# Patient Record
Sex: Female | Born: 1960 | Race: White | Hispanic: No | Marital: Married | Smoking: Never smoker
Health system: Southern US, Community
[De-identification: ages and names within clinical notes are randomized; demographics above are authoritative.]

## PROBLEM LIST (undated history)

## (undated) DIAGNOSIS — R3989 Other symptoms and signs involving the genitourinary system: Secondary | ICD-10-CM

## (undated) DIAGNOSIS — M26609 Unspecified temporomandibular joint disorder, unspecified side: Secondary | ICD-10-CM

## (undated) DIAGNOSIS — M6740 Ganglion, unspecified site: Secondary | ICD-10-CM

## (undated) DIAGNOSIS — G43909 Migraine, unspecified, not intractable, without status migrainosus: Secondary | ICD-10-CM

## (undated) DIAGNOSIS — I10 Essential (primary) hypertension: Secondary | ICD-10-CM

## (undated) DIAGNOSIS — L509 Urticaria, unspecified: Secondary | ICD-10-CM

## (undated) HISTORY — PX: ABDOMINAL HYSTERECTOMY: SHX81

## (undated) HISTORY — PX: ABLATION: SHX5711

## (undated) HISTORY — DX: Essential (primary) hypertension: I10

## (undated) HISTORY — DX: Ganglion, unspecified site: M67.40

---

## 1998-10-27 ENCOUNTER — Other Ambulatory Visit: Admission: RE | Admit: 1998-10-27 | Discharge: 1998-10-27 | Payer: Self-pay | Admitting: Obstetrics and Gynecology

## 1999-10-31 ENCOUNTER — Other Ambulatory Visit: Admission: RE | Admit: 1999-10-31 | Discharge: 1999-10-31 | Payer: Self-pay | Admitting: Obstetrics and Gynecology

## 2000-11-09 ENCOUNTER — Other Ambulatory Visit: Admission: RE | Admit: 2000-11-09 | Discharge: 2000-11-09 | Payer: Self-pay | Admitting: Obstetrics and Gynecology

## 2001-12-09 ENCOUNTER — Other Ambulatory Visit: Admission: RE | Admit: 2001-12-09 | Discharge: 2001-12-09 | Payer: Self-pay | Admitting: Gynecology

## 2002-03-19 ENCOUNTER — Other Ambulatory Visit: Admission: RE | Admit: 2002-03-19 | Discharge: 2002-03-19 | Payer: Self-pay | Admitting: Obstetrics and Gynecology

## 2003-01-07 ENCOUNTER — Other Ambulatory Visit: Admission: RE | Admit: 2003-01-07 | Discharge: 2003-01-07 | Payer: Self-pay | Admitting: Obstetrics & Gynecology

## 2004-03-14 ENCOUNTER — Other Ambulatory Visit: Admission: RE | Admit: 2004-03-14 | Discharge: 2004-03-14 | Payer: Self-pay | Admitting: Obstetrics & Gynecology

## 2005-05-10 ENCOUNTER — Other Ambulatory Visit: Admission: RE | Admit: 2005-05-10 | Discharge: 2005-05-10 | Payer: Self-pay | Admitting: Obstetrics & Gynecology

## 2010-11-20 LAB — HM COLONOSCOPY

## 2011-03-07 ENCOUNTER — Encounter (HOSPITAL_COMMUNITY)
Admission: RE | Admit: 2011-03-07 | Discharge: 2011-03-07 | Disposition: A | Discharge status: Home / Self Care | Payer: BC Managed Care – PPO | Source: Ambulatory Visit | Attending: Obstetrics and Gynecology | Admitting: Obstetrics and Gynecology

## 2011-03-07 DIAGNOSIS — Z01818 Encounter for other preprocedural examination: Secondary | ICD-10-CM | POA: Insufficient documentation

## 2011-03-07 DIAGNOSIS — Z01810 Encounter for preprocedural cardiovascular examination: Secondary | ICD-10-CM | POA: Insufficient documentation

## 2011-03-07 DIAGNOSIS — Z01812 Encounter for preprocedural laboratory examination: Secondary | ICD-10-CM | POA: Insufficient documentation

## 2011-03-07 LAB — BASIC METABOLIC PANEL
Chloride: 103 mEq/L (ref 96–112)
GFR calc Af Amer: >60 mL/min (ref >60)
GFR calc non Af Amer: >60 mL/min (ref >60)
Potassium: 3.1 mEq/L — ABNORMAL LOW (ref 3.5–5.1)
Sodium: 140 mEq/L (ref 135–145)

## 2011-03-07 LAB — CBC
Platelets: 291 10*3/uL (ref 150–400)
RBC: 4.93 MIL/uL (ref 3.87–5.11)
RDW: 12.7 % (ref 11.5–15.5)
WBC: 8.3 10*3/uL (ref 4.0–10.5)

## 2011-03-14 ENCOUNTER — Ambulatory Visit (HOSPITAL_COMMUNITY)
Admission: RE | Admit: 2011-03-14 | Discharge: 2011-03-14 | Disposition: A | Discharge status: Home / Self Care | Payer: BC Managed Care – PPO | Source: Ambulatory Visit | Attending: Obstetrics and Gynecology | Admitting: Obstetrics and Gynecology

## 2011-03-14 ENCOUNTER — Other Ambulatory Visit: Payer: Self-pay | Admitting: Obstetrics and Gynecology

## 2011-03-14 DIAGNOSIS — Z01812 Encounter for preprocedural laboratory examination: Secondary | ICD-10-CM | POA: Insufficient documentation

## 2011-03-14 DIAGNOSIS — N920 Excessive and frequent menstruation with regular cycle: Secondary | ICD-10-CM | POA: Insufficient documentation

## 2011-03-14 DIAGNOSIS — Z01818 Encounter for other preprocedural examination: Secondary | ICD-10-CM | POA: Insufficient documentation

## 2011-03-14 LAB — PREGNANCY, URINE: Preg Test, Ur: NEGATIVE

## 2011-03-15 NOTE — Op Note (Signed)
  NAME:  Taylor Mullins, Taylor Mullins              ACCOUNT NO.:  [id number]  MEDICAL RECORD NO.:  [id number]           PATIENT TYPE:  O  LOCATION:  [facility]                          FACILITY:  [facility]  PHYSICIAN:  Malva Limes, M.D.    DATE OF BIRTH:  1961/03/30  DATE OF PROCEDURE:  03/14/2011 DATE OF DISCHARGE:                              OPERATIVE REPORT   PREOPERATIVE DIAGNOSIS:  Menorrhagia.  POSTOPERATIVE DIAGNOSIS:  Menorrhagia.  PROCEDURES: 1. Dilation and curettage. 2. NovaSure endometrial ablation.  SURGEON:  Malva Limes, MD  ANESTHESIA:  General with paracervical block.  DRAINS:  Foley bedside drainage.  ANTIBIOTIC:  Ancef 1 g.  SPECIMENS:  Endometrial curettings sent to Pathology.  COMPLICATIONS:  None.  PROCEDURE:  The patient was taken to the operating room where she was placed in dorsal supine position and general anesthetic was administered without difficulty.  She was placed in dorsal lithotomy position.  She was prepped and draped in the usual fashion for this procedure.  A sterile speculum was placed in the vagina.  20 mL of 1% lidocaine was used for paracervical block.  The cervix was then serially dilated to a 27-French.  The uterus was sounded to 9 cm.  The cervical length was 3 cm given a cavitary length of 6 cm.  Sharp curettage was then performed, tissue sent to pathology.  The NovaSure device was placed into the uterine cavity, opened, the width was 4.5 cm.  The device was then checked for placement.  Once that was accomplished, the device was turned on.  The NovaSure ablation was performed without difficulty.  The patient tolerated the procedure well.  The device was removed.  The patient was awoken and taken into the recovery room in stable condition.  She will be discharged home.  She will be sent home with Percocet to take p.r.n. She will follow up in the office in 4 weeks.          ______________________________ Malva Limes, M.D.     MA/MEDQ  D:   03/14/2011  T:  03/15/2011  Job:  [id number]  Electronically Signed by Malva Limes M.D. on 03/15/2011 09:00:23 AM

## 2011-11-09 ENCOUNTER — Other Ambulatory Visit: Payer: Self-pay | Admitting: Obstetrics and Gynecology

## 2013-01-20 ENCOUNTER — Other Ambulatory Visit: Payer: Self-pay | Admitting: Gastroenterology

## 2013-11-20 LAB — HM PAP SMEAR: HM Pap smear: NORMAL

## 2014-01-19 ENCOUNTER — Other Ambulatory Visit: Payer: Self-pay | Admitting: Obstetrics and Gynecology

## 2015-01-25 ENCOUNTER — Other Ambulatory Visit: Payer: Self-pay | Admitting: Obstetrics and Gynecology

## 2015-01-26 LAB — CYTOLOGY - PAP

## 2016-02-10 ENCOUNTER — Other Ambulatory Visit: Payer: Self-pay | Admitting: Obstetrics and Gynecology

## 2016-02-11 LAB — CYTOLOGY - PAP

## 2016-09-18 ENCOUNTER — Ambulatory Visit (INDEPENDENT_AMBULATORY_CARE_PROVIDER_SITE_OTHER): Payer: BC Managed Care – PPO | Admitting: Primary Care

## 2016-09-18 ENCOUNTER — Encounter: Payer: Self-pay | Admitting: Primary Care

## 2016-09-18 VITALS — BP 124/80 | HR 72 | Temp 98.4°F | Ht 68.0 in | Wt 206.2 lb

## 2016-09-18 DIAGNOSIS — Z23 Encounter for immunization: Secondary | ICD-10-CM | POA: Diagnosis not present

## 2016-09-18 DIAGNOSIS — I10 Essential (primary) hypertension: Secondary | ICD-10-CM | POA: Diagnosis not present

## 2016-09-18 DIAGNOSIS — N390 Urinary tract infection, site not specified: Secondary | ICD-10-CM | POA: Diagnosis not present

## 2016-09-18 NOTE — Assessment & Plan Note (Signed)
Diagnosed years ago, managed on HCTZ 25 mg and metoprolol succinate. BP stable in clinic today. Continue same.

## 2016-09-18 NOTE — Assessment & Plan Note (Signed)
Frequent UTI's from January through September 2017. Currently managed on macrodantin for which she takes after intercourse. No recent UTI. Continue same.

## 2016-09-18 NOTE — Progress Notes (Signed)
   Subjective:    Patient ID: Taylor Mullins, female    DOB: 03/11/1961, 55 y.o.   MRN: [id number]  HPI  Taylor Mullins is a 55 year old female who presents today to establish care and discuss the problems mentioned below. Will obtain old records. She follows with GYN annually. She will be undergoing a hysterectomy in June 2018. Her last physical was in January 2017.   1) Essential Hypertension: Diagnosed years ago. Currently managed on HCTZ 25 mg and Metoprolol Succinate 50 mg. She denies chest pain, dizziness, visual changes.   BP Readings from Last 3 Encounters:  09/18/16 124/80     2) Recurrent UTI: Currently managed on nitrofurantoin 100 mg for which she takes each time after intercourse. History of recurrent UTI's from January through September 2017. No prior history. No UTI since September 2017.  Review of Systems  Respiratory: Negative for shortness of breath.   Cardiovascular: Negative for chest pain.  Genitourinary: Negative for dysuria and frequency.       Scheduled to have hysterectomy in June 2018  Neurological: Negative for dizziness and headaches.       Past Medical History:  Diagnosis Date  . Essential hypertension   . Ganglion cyst    right ankle     Social History   Social History  . Marital status: Married    Spouse name: N/A  . Number of children: N/A  . Years of education: N/A   Occupational History  . Not on file.   Social History Main Topics  . Smoking status: Never Smoker  . Smokeless tobacco: Never Used  . Alcohol use No  . Drug use: Unknown  . Sexual activity: Not on file   Other Topics Concern  . Not on file   Social History Narrative   Married.   3 children.    Works as a Runner, broadcasting/film/videoTeacher at [employer].   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     No past surgical history on file.  Family History  Problem Relation Age of Onset  . Colon cancer Mother   . Hypertension Mother   . Hyperlipidemia Mother   . Crohn's disease  Father   . Heart disease Father     Allergies  Allergen Reactions  . 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    Lips swell and mouth    No current outpatient prescriptions on file prior to visit.   No current facility-administered medications on file prior to visit.     BP 124/80   Pulse 72   Temp 98.4 F (36.9 C) (Oral)   Ht 5\' 8"  (1.727 m)   Wt 206 lb 4 oz (93.6 kg)   BMI 31.36 kg/m    Objective:   Physical Exam  Constitutional: She appears well-nourished.  Neck: Neck supple.  Cardiovascular: Normal rate and regular rhythm.   Pulmonary/Chest: Effort normal and breath sounds normal.  Skin: Skin is warm and dry.  Psychiatric: She has a normal mood and affect.          Assessment & Plan:

## 2016-09-18 NOTE — Patient Instructions (Addendum)
You were provided with an influenza vaccination today.   Please schedule a physical with me in January 2018. You may also schedule a lab only appointment 3-4 days prior. We will discuss your lab results in detail during your physical.  It was a pleasure to meet you today! Please don't hesitate to call me with any questions. Welcome to [facility]!

## 2016-09-18 NOTE — Progress Notes (Signed)
Pre visit review using our clinic review tool, if applicable. No additional management support is needed unless otherwise documented below in the visit note. 

## 2016-09-21 ENCOUNTER — Encounter: Payer: Self-pay | Admitting: Primary Care

## 2016-12-19 ENCOUNTER — Encounter: Payer: Self-pay | Admitting: Primary Care

## 2016-12-19 ENCOUNTER — Ambulatory Visit (INDEPENDENT_AMBULATORY_CARE_PROVIDER_SITE_OTHER): Payer: BC Managed Care – PPO | Admitting: Primary Care

## 2016-12-19 VITALS — BP 124/82 | HR 84 | Temp 97.9°F | Ht 68.0 in | Wt 212.8 lb

## 2016-12-19 DIAGNOSIS — Z0000 Encounter for general adult medical examination without abnormal findings: Secondary | ICD-10-CM | POA: Insufficient documentation

## 2016-12-19 DIAGNOSIS — Z1159 Encounter for screening for other viral diseases: Secondary | ICD-10-CM

## 2016-12-19 DIAGNOSIS — I10 Essential (primary) hypertension: Secondary | ICD-10-CM

## 2016-12-19 NOTE — Patient Instructions (Addendum)
Complete lab work prior to leaving today. I will notify you of your results once received.   It's important to improve your diet by reducing consumption of fast food, fried food, processed snack foods, sugary drinks. Increase consumption of fresh vegetables and fruits, whole grains, water.  Ensure you are drinking 64 ounces of water daily.  Start exercising. You should be getting 150 minutes of moderate intensity exercise weekly.  Follow up in 1 year for your annual physical or sooner if needed.  It was a pleasure to see you today!

## 2016-12-19 NOTE — Progress Notes (Signed)
Pre visit review using our clinic review tool, if applicable. No additional management support is needed unless otherwise documented below in the visit note. 

## 2016-12-19 NOTE — Assessment & Plan Note (Signed)
Immunizations UTD. Pap UTD. Mammogram due in March 2018. Discussed the importance of a healthy diet and regular exercise in order for weight loss, and to reduce the risk of other medical diseases. Exam unremarkable. Labs pending. Follow up in 1 year for annual physical.

## 2016-12-19 NOTE — Progress Notes (Signed)
Subjective:    Patient ID: Taylor Mullins, female    DOB: March 12, 1961, 56 y.o.   MRN: [id number]  HPI  Taylor Mullins is a 56 year old female who presents today for complete physical.  Immunizations: -Tetanus: Completed in 2015 -Influenza: Completed in October 2017   Diet: She endorses a fair diet Breakfast: Skips, fast food  Lunch: Sandwich, soup, applesauce  Dinner: Meat, vegetable, starch, sandwich Snacks: Graham crackers, chocolate  Desserts: Daily Beverages: Sweet tea, water, juice  Exercise: She does not currently exercise Eye exam: Completed 2 years ago, no acute changes in vision Dental exam: Completes every 6 months Colonoscopy: Completed in 2014, due in 2019 Pap Smear: Completed in 2017, follows with GYN. Mammogram: Completed in 2017, due in March 2018 Hep C Screen: Never being screened.    Review of Systems  Constitutional: Negative for unexpected weight change.  HENT: Negative for rhinorrhea.   Respiratory: Negative for cough and shortness of breath.   Cardiovascular: Negative for chest pain.  Gastrointestinal: Negative for constipation and diarrhea.  Genitourinary: Negative for difficulty urinating and menstrual problem.  Musculoskeletal: Negative for arthralgias and myalgias.       Right knee pain when kneeling, right shoulder pain with certain movements, occurs once weekly/very sporatic. Experiences sharp pain to right shoulder.  Skin: Negative for rash.       Ganglion cyst to right lateral surgeon.   Allergic/Immunologic: Negative for environmental allergies.  Neurological: Negative for dizziness, numbness and headaches.  Psychiatric/Behavioral:       Denies concerns for anxiety or depression       Past Medical History:  Diagnosis Date  . Essential hypertension   . Ganglion cyst    right ankle     Social History   Social History  . Marital status: Married    Spouse name: N/A  . Number of children: N/A  . Years of education: N/A    Occupational History  . Not on file.   Social History Main Topics  . Smoking status: Never Smoker  . Smokeless tobacco: Never Used  . Alcohol use No  . Drug use: Unknown  . Sexual activity: Not on file   Other Topics Concern  . Not on file   Social History Narrative   Married.   3 children.    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     No past surgical history on file.  Family History  Problem Relation Age of Onset  . Colon cancer Mother   . Hypertension Mother   . Hyperlipidemia Mother   . Crohn's disease Father   . Heart disease Father     Allergies  Allergen Reactions  . 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    Lips swell and mouth    Current Outpatient Prescriptions on File Prior to Visit  Medication Sig Dispense Refill  . aspirin EC 81 MG tablet Take 81 mg by mouth daily.    . cetirizine (ZYRTEC) 10 MG chewable tablet Chew 10 mg by mouth daily.    . hydrochlorothiazide (HYDRODIURIL) 25 MG tablet Take 25 mg by mouth daily.    . metoprolol succinate (TOPROL-XL) 50 MG 24 hr tablet Take 50 mg by mouth daily. Take with or immediately following a meal.    . nitrofurantoin (MACRODANTIN) 100 MG capsule Take 100 mg by mouth. Take one capsule by mouth after sex     No current facility-administered medications on file prior to visit.  BP 124/82   Pulse 84   Temp 97.9 F (36.6 C) (Oral)   Ht 5\' 8"  (1.727 m)   Wt 212 lb 12.8 oz (96.5 kg)   SpO2 98%   BMI 32.36 kg/m    Objective:   Physical Exam  Constitutional: She is oriented to person, place, and time. She appears well-nourished.  HENT:  Right Ear: Tympanic membrane and ear canal normal.  Left Ear: Tympanic membrane and ear canal normal.  Nose: Nose normal.  Mouth/Throat: Oropharynx is clear and moist.  Eyes: Conjunctivae and EOM are normal. Pupils are equal, round, and reactive to light.  Neck: Neck supple. No thyromegaly present.  Cardiovascular:  Normal rate and regular rhythm.   No murmur heard. Pulmonary/Chest: Effort normal and breath sounds normal. She has no rales.  Abdominal: Soft. Bowel sounds are normal. There is no tenderness.  Musculoskeletal: Normal range of motion.  Lymphadenopathy:    She has no cervical adenopathy.  Neurological: She is alert and oriented to person, place, and time. She has normal reflexes. No cranial nerve deficit.  Skin: Skin is warm and dry. No rash noted.  1/2 cm raised ganglion cyst to right latera ankle. Non tender.  Psychiatric: She has a normal mood and affect.          Assessment & Plan:

## 2016-12-19 NOTE — Assessment & Plan Note (Signed)
Stable today, continue HCTZ and Toprol XL.

## 2016-12-20 ENCOUNTER — Other Ambulatory Visit: Payer: Self-pay | Admitting: Primary Care

## 2016-12-20 DIAGNOSIS — E785 Hyperlipidemia, unspecified: Secondary | ICD-10-CM

## 2016-12-20 LAB — COMPREHENSIVE METABOLIC PANEL
ALBUMIN: 4.9 g/dL (ref 3.5–5.2)
ALT: 19 U/L (ref 0–35)
AST: 22 U/L (ref 0–37)
Alkaline Phosphatase: 82 U/L (ref 39–117)
BUN: 17 mg/dL (ref 6–23)
CHLORIDE: 100 meq/L (ref 96–112)
CO2: 27 meq/L (ref 19–32)
CREATININE: 0.92 mg/dL (ref 0.40–1.20)
Calcium: 10.1 mg/dL (ref 8.4–10.5)
GFR: 67.43 mL/min (ref >60.00)
GLUCOSE: 84 mg/dL (ref 70–99)
Potassium: 3.6 mEq/L (ref 3.5–5.1)
SODIUM: 139 meq/L (ref 135–145)
Total Bilirubin: 0.7 mg/dL (ref 0.2–1.2)
Total Protein: 8.4 g/dL — ABNORMAL HIGH (ref 6.0–8.3)

## 2016-12-20 LAB — HEPATITIS C ANTIBODY: HCV AB: NEGATIVE

## 2016-12-20 LAB — LDL CHOLESTEROL, DIRECT: LDL DIRECT: 142 mg/dL

## 2016-12-20 LAB — LIPID PANEL
CHOL/HDL RATIO: 4
Cholesterol: 249 mg/dL — ABNORMAL HIGH (ref 0–200)
HDL: 63.7 mg/dL (ref >39.00)
NONHDL: 185.03
Triglycerides: 297 mg/dL — ABNORMAL HIGH (ref 0.0–149.0)
VLDL: 59.4 mg/dL — ABNORMAL HIGH (ref 0.0–40.0)

## 2017-01-07 ENCOUNTER — Other Ambulatory Visit: Payer: Self-pay | Admitting: Primary Care

## 2017-01-07 DIAGNOSIS — I10 Essential (primary) hypertension: Secondary | ICD-10-CM

## 2017-01-08 NOTE — Telephone Encounter (Signed)
Ok to refill? Electronically refill request for   metoprolol succinate (TOPROL-XL) 50 MG 24 hr tablet  hydrochlorothiazide (HYDRODIURIL) 25 MG tablet  Medications have not been prescribed by Jae DireKate. Last seen on CPE on 12/19/2016

## 2017-02-12 ENCOUNTER — Other Ambulatory Visit: Payer: Self-pay | Admitting: Obstetrics and Gynecology

## 2017-02-14 ENCOUNTER — Encounter: Payer: Self-pay | Admitting: Primary Care

## 2017-02-14 ENCOUNTER — Ambulatory Visit (INDEPENDENT_AMBULATORY_CARE_PROVIDER_SITE_OTHER): Payer: BC Managed Care – PPO | Admitting: Primary Care

## 2017-02-14 ENCOUNTER — Ambulatory Visit (INDEPENDENT_AMBULATORY_CARE_PROVIDER_SITE_OTHER)
Admission: RE | Admit: 2017-02-14 | Discharge: 2017-02-14 | Disposition: A | Discharge status: Home / Self Care | Payer: BC Managed Care – PPO | Source: Ambulatory Visit | Attending: Primary Care | Admitting: Primary Care

## 2017-02-14 VITALS — BP 124/80 | HR 80 | Temp 97.4°F | Ht 68.0 in | Wt 207.8 lb

## 2017-02-14 DIAGNOSIS — M79674 Pain in right toe(s): Secondary | ICD-10-CM

## 2017-02-14 NOTE — Patient Instructions (Signed)
Complete xray(s) prior to leaving today.   Continue Advil and rest.   I will be in touch with you soon! It was a pleasure to see you today!

## 2017-02-14 NOTE — Progress Notes (Signed)
Subjective:    Patient ID: Taylor Mullins, female    DOB: 1960-11-21, 56 y.o.   MRN: [id number]  HPI  Ms. Hair is a 56 year old female with a chief complaint of right second toe pain. Her pain has been present for the past two weeks. She was walking around the [place] area for three days with her class, this made her pain worse. She notices swelling in the morning and will experience increased pain throughout the day. Her pain is worse with weight bearing. She denies injury, erythema. She's taken Advil and rested with temporary improvement.   Review of Systems  Constitutional: Negative for fever.  Musculoskeletal: Positive for arthralgias.  Skin: Negative for color change.       Past Medical History:  Diagnosis Date  . Essential hypertension   . Ganglion cyst    right ankle     Social History   Social History  . Marital status: Married    Spouse name: N/A  . Number of children: N/A  . Years of education: N/A   Occupational History  . Not on file.   Social History Main Topics  . Smoking status: Never Smoker  . Smokeless tobacco: Never Used  . Alcohol use No  . Drug use: Unknown  . Sexual activity: Not on file   Other Topics Concern  . Not on file   Social History Narrative   Married.   3 children.    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     No past surgical history on file.  Family History  Problem Relation Age of Onset  . Colon cancer Mother   . Hypertension Mother   . Hyperlipidemia Mother   . Crohn's disease Father   . Heart disease Father     Allergies  Allergen Reactions  . 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    Lips swell and mouth    Current Outpatient Prescriptions on File Prior to Visit  Medication Sig Dispense Refill  . aspirin EC 81 MG tablet Take 81 mg by mouth daily.    . cetirizine (ZYRTEC) 10 MG chewable tablet Chew 10 mg by mouth daily.    . hydrochlorothiazide (HYDRODIURIL)  25 MG tablet TAKE 1 TABLET BY MOUTH EVERY MORNING TO CONTROL HIGH BLOOD PRESSURE 90 tablet 3  . metoprolol succinate (TOPROL-XL) 50 MG 24 hr tablet TAKE 1 TABLET BY MOUTH DAILY TO CONTROL BLOOD PRESSURE 90 tablet 3  . nitrofurantoin (MACRODANTIN) 100 MG capsule Take 100 mg by mouth. Take one capsule by mouth after sex     No current facility-administered medications on file prior to visit.     BP 124/80   Pulse 80   Temp 97.4 F (36.3 C) (Oral)   Ht 5\' 8"  (1.727 m)   Wt 207 lb 12.8 oz (94.3 kg)   SpO2 97%   BMI 31.60 kg/m    Objective:   Physical Exam  Constitutional: She appears well-nourished.  Neck: Neck supple.  Cardiovascular: Normal rate and regular rhythm.   Pulmonary/Chest: Effort normal and breath sounds normal.  Musculoskeletal:  Tenderness to PIP joint of second toe of right foot. Minor swelling. No erythema.  Skin: Skin is warm and dry. No erythema.          Assessment & Plan:  Toe Pain:  Located to second toe of right foot x 2 weeks. No injury, did walk around in [place] x 3 days the week after  pain stared/ Exam today without evidence of infection.  Suspect tendon strain/joint inflammation. Check xray today. Discussed use of Advil, rest. Buddy taped toes in clinic. Discussed proper shoes.  Will await results.  Morrie Sheldonlark,Clance Baquero Kendal, NP

## 2017-02-14 NOTE — Progress Notes (Signed)
Pre visit review using our clinic review tool, if applicable. No additional management support is needed unless otherwise documented below in the visit note. 

## 2017-02-15 LAB — CYTOLOGY - PAP

## 2017-03-06 ENCOUNTER — Encounter: Payer: Self-pay | Admitting: Primary Care

## 2017-03-06 ENCOUNTER — Ambulatory Visit (INDEPENDENT_AMBULATORY_CARE_PROVIDER_SITE_OTHER): Payer: BC Managed Care – PPO | Admitting: Primary Care

## 2017-03-06 VITALS — BP 122/82 | HR 71 | Temp 97.8°F | Ht 68.0 in | Wt 207.1 lb

## 2017-03-06 DIAGNOSIS — H9202 Otalgia, left ear: Secondary | ICD-10-CM

## 2017-03-06 NOTE — Patient Instructions (Signed)
Ear Fullness/Ear Pressure: Try using Flonase (fluticasone) nasal spray. Instill 1 spray in each nostril twice daily.   Continue Zyrtec tablets for allergies.  Please notify me if no improvement in 1 week.  It was a pleasure to see you today!   Eustachian Tube Dysfunction The eustachian tube connects the middle ear to the back of the nose. It regulates air pressure in the middle ear by allowing air to move between the ear and nose. It also helps to drain fluid from the middle ear space. When the eustachian tube does not function properly, air pressure, fluid, or both can build up in the middle ear. Eustachian tube dysfunction can affect one or both ears. What are the causes? This condition happens when the eustachian tube becomes blocked or cannot open normally. This may result from:  Ear infections.  Colds and other upper respiratory infections.  Allergies.  Irritation, such as from cigarette smoke or acid from the stomach coming up into the esophagus (gastroesophageal reflux).  Sudden changes in air pressure, such as from descending in an airplane.  Abnormal growths in the nose or throat, such as nasal polyps, tumors, or enlarged tissue at the back of the throat (adenoids). What increases the risk? This condition may be more likely to develop in people who smoke and people who are overweight. Eustachian tube dysfunction may also be more likely to develop in children, especially children who have:  Certain birth defects of the mouth, such as cleft palate.  Large tonsils and adenoids. What are the signs or symptoms? Symptoms of this condition may include:  A feeling of fullness in the ear.  Ear pain.  Clicking or popping noises in the ear.  Ringing in the ear.  Hearing loss.  Loss of balance. Symptoms may get worse when the air pressure around you changes, such as when you travel to an area of high elevation or fly on an airplane. How is this diagnosed? This condition  may be diagnosed based on:  Your symptoms.  A physical exam of your ear, nose, and throat.  Tests, such as those that measure:  The movement of your eardrum (tympanogram).  Your hearing (audiometry). How is this treated? Treatment depends on the cause and severity of your condition. If your symptoms are mild, you may be able to relieve your symptoms by moving air into ("popping") your ears. If you have symptoms of fluid in your ears, treatment may include:  Decongestants.  Antihistamines.  Nasal sprays or ear drops that contain medicines that reduce swelling (steroids). In some cases, you may need to have a procedure to drain the fluid in your eardrum (myringotomy). In this procedure, a small tube is placed in the eardrum to:  Drain the fluid.  Restore the air in the middle ear space. Follow these instructions at home:  Take over-the-counter and prescription medicines only as told by your health care provider.  Use techniques to help pop your ears as recommended by your health care provider. These may include:  Chewing gum.  Yawning.  Frequent, forceful swallowing.  Closing your mouth, holding your nose closed, and gently blowing as if you are trying to blow air out of your nose.  Do not do any of the following until your health care provider approves:  Travel to high altitudes.  Fly in airplanes.  Work in a Estate agent or room.  Scuba dive.  Keep your ears dry. Dry your ears completely after showering or bathing.  Do not smoke.  Keep all follow-up visits as told by your health care provider. This is important. Contact a health care provider if:  Your symptoms do not go away after treatment.  Your symptoms come back after treatment.  You are unable to pop your ears.  You have:  A fever.  Pain in your ear.  Pain in your head or neck.  Fluid draining from your ear.  Your hearing suddenly changes.  You become very dizzy.  You lose your  balance. This information is not intended to replace advice given to you by your health care provider. Make sure you discuss any questions you have with your health care provider. Document Released: 12/03/2015 Document Revised: 04/13/2016 Document Reviewed: 11/25/2014 Elsevier Interactive Patient Education  2017 [employer].

## 2017-03-06 NOTE — Progress Notes (Signed)
Pre visit review using our clinic review tool, if applicable. No additional management support is needed unless otherwise documented below in the visit note. 

## 2017-03-06 NOTE — Progress Notes (Signed)
Subjective:    Patient ID: Taylor Mullins, female    DOB: Aug 14, 1961, 57 y.o.   MRN: [id number]  HPI  Taylor Mullins is a 56 year old female who presents today with a chief complaint of ear pain. She also reports fullness. Her symptoms are present to the left ear. Her pain has been present for the past 2 weeks. She denies fevers, cough, nasal congestion, sore thraot. She's been taking Zyrtec daily.   Review of Systems  Constitutional: Negative for chills, fatigue and fever.  HENT: Positive for ear pain. Negative for congestion, sinus pressure and sore throat.        Ear fullness  Respiratory: Negative for cough.   Allergic/Immunologic: Positive for environmental allergies.       Past Medical History:  Diagnosis Date  . Essential hypertension   . Ganglion cyst    right ankle     Social History   Social History  . Marital status: Married    Spouse name: N/A  . Number of children: N/A  . Years of education: N/A   Occupational History  . Not on file.   Social History Main Topics  . Smoking status: Never Smoker  . Smokeless tobacco: Never Used  . Alcohol use No  . Drug use: Unknown  . Sexual activity: Not on file   Other Topics Concern  . Not on file   Social History Narrative   Married.   3 children.    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     No past surgical history on file.  Family History  Problem Relation Age of Onset  . Colon cancer Mother   . Hypertension Mother   . Hyperlipidemia Mother   . Crohn's disease Father   . Heart disease Father     Allergies  Allergen Reactions  . 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    Lips swell and mouth    Current Outpatient Prescriptions on File Prior to Visit  Medication Sig Dispense Refill  . aspirin EC 81 MG tablet Take 81 mg by mouth daily.    . cetirizine (ZYRTEC) 10 MG chewable tablet Chew 10 mg by mouth daily.    . hydrochlorothiazide (HYDRODIURIL)  25 MG tablet TAKE 1 TABLET BY MOUTH EVERY MORNING TO CONTROL HIGH BLOOD PRESSURE 90 tablet 3  . metoprolol succinate (TOPROL-XL) 50 MG 24 hr tablet TAKE 1 TABLET BY MOUTH DAILY TO CONTROL BLOOD PRESSURE 90 tablet 3   No current facility-administered medications on file prior to visit.     BP 122/82   Pulse 71   Temp 97.8 F (36.6 C) (Oral)   Ht  (1.727 m)   Wt 207 lb 1.9 oz (93.9 kg)   SpO2 96%   BMI 31.49 kg/m    Objective:   Physical Exam  Constitutional: She appears well-nourished.  HENT:  Right Ear: Ear canal normal. Tympanic membrane is retracted. Tympanic membrane is not erythematous.  Left Ear: Tympanic membrane and ear canal normal. Tympanic membrane is not erythematous.  Nose: Right sinus exhibits no maxillary sinus tenderness and no frontal sinus tenderness. Left sinus exhibits no maxillary sinus tenderness and no frontal sinus tenderness.  Mouth/Throat: Oropharynx is clear and moist.  Eyes: Conjunctivae are normal.  Neck: Neck supple.  Cardiovascular: Normal rate and regular rhythm.   Pulmonary/Chest: Effort normal and breath sounds normal. She has no wheezes. She has no rales.  Lymphadenopathy:    She has  no cervical adenopathy.  Skin: Skin is warm and dry.          Assessment & Plan:  Otalgia:  Present for 2 weeks to left ear. No other URI symptoms. Exam today without evidence of infection. Symptoms likely secondary to eustachian tube dysfunction and/or allergies. Will treat with Flonase BID, continue Zyrtec. She will follow up PRN.  Morrie Sheldon, NP

## 2017-03-15 ENCOUNTER — Other Ambulatory Visit: Payer: Self-pay | Admitting: Urology

## 2017-04-06 ENCOUNTER — Other Ambulatory Visit: Payer: Self-pay | Admitting: Urology

## 2017-04-17 ENCOUNTER — Ambulatory Visit: Admit: 2017-04-17 | Payer: BC Managed Care – PPO | Admitting: Urology

## 2017-04-17 SURGERY — ANTERIOR (CYSTOCELE) AND POSTERIOR REPAIR (RECTOCELE)
Anesthesia: General

## 2017-04-18 ENCOUNTER — Other Ambulatory Visit (INDEPENDENT_AMBULATORY_CARE_PROVIDER_SITE_OTHER): Payer: BC Managed Care – PPO

## 2017-04-18 DIAGNOSIS — E785 Hyperlipidemia, unspecified: Secondary | ICD-10-CM | POA: Diagnosis not present

## 2017-04-19 LAB — LIPID PANEL
CHOL/HDL RATIO: 4
CHOLESTEROL: 241 mg/dL — AB (ref 0–200)
HDL: 63.3 mg/dL (ref >39.00)
NONHDL: 177.3
Triglycerides: 272 mg/dL — ABNORMAL HIGH (ref 0.0–149.0)
VLDL: 54.4 mg/dL — ABNORMAL HIGH (ref 0.0–40.0)

## 2017-04-19 LAB — LDL CHOLESTEROL, DIRECT: Direct LDL: 143 mg/dL

## 2017-05-08 ENCOUNTER — Other Ambulatory Visit: Payer: Self-pay | Admitting: Obstetrics and Gynecology

## 2017-05-28 NOTE — Progress Notes (Signed)
Please place orders in EPIC as patient is being scheduled for a pre-op appointment! Thank you! 

## 2017-06-08 NOTE — Patient Instructions (Signed)
Taylor Mullins  06/08/2017   Your procedure is scheduled on: 06-19-17  Report to [facility] Main  Entrance [place]  elevators to 3rd floor to  Short Stay Center at 530AM.    Call this number if you have problems the morning of surgery [phone]    Remember: ONLY 1 PERSON MAY GO WITH YOU TO SHORT STAY TO GET  READY MORNING OF YOUR SURGERY.  Do not eat food or drink liquids :After Midnight.     Take these medicines the morning of surgery with A SIP OF WATER: metoprolol                                You may not have any metal on your body including hair pins and              piercings  Do not wear jewelry, make-up, lotions, powders or perfumes, deodorant             Do not wear nail polish.  Do not shave  48 hours prior to surgery.                Do not bring valuables to the hospital. [place] IS NOT             RESPONSIBLE   FOR VALUABLES.  Contacts, dentures or bridgework may not be worn into surgery.  Leave suitcase in the car. After surgery it may be brought to your room.               Please read over the following fact sheets you were given: _____________________________________________________________________             [facility] - Preparing for [facility] Before surgery, you can play an important role.  Because skin is not sterile, your skin needs to be as free of germs as possible.  You can reduce the number of germs on your skin by washing with CHG (chlorahexidine gluconate) soap before surgery.  CHG is an antiseptic cleaner which kills germs and bonds with the skin to continue killing germs even after washing. Please DO NOT use if you have an allergy to CHG or antibacterial soaps.  If your skin becomes reddened/irritated stop using the CHG and inform your nurse when you arrive at Short Stay. Do not shave (including legs and underarms) for at least 48 hours prior to the first CHG shower.  You may shave your face/neck. Please follow these  instructions carefully:  1.  Shower with CHG Soap the night before surgery and the  morning of Surgery.  2.  If you choose to wash your hair, wash your hair first as usual with your  normal  shampoo.  3.  After you shampoo, rinse your hair and body thoroughly to remove the  shampoo.                           4.  Use CHG as you would any other liquid soap.  You can apply chg directly  to the skin and wash                       Gently with a scrungie or clean washcloth.  5.  Apply the CHG Soap to your body ONLY FROM THE NECK  DOWN.   Do not use on face/ open                           Wound or open sores. Avoid contact with eyes, ears mouth and genitals (private parts).                       Wash face,  Genitals (private parts) with your normal soap.             6.  Wash thoroughly, paying special attention to the area where your surgery  will be performed.  7.  Thoroughly rinse your body with warm water from the neck down.  8.  DO NOT shower/wash with your normal soap after using and rinsing off  the CHG Soap.                9.  Pat yourself dry with a clean towel.            10.  Wear clean pajamas.            11.  Place clean sheets on your bed the night of your first shower and do not  sleep with pets. Day of Surgery : Do not apply any lotions/deodorants the morning of surgery.  Please wear clean clothes to the hospital/surgery center.  FAILURE TO FOLLOW THESE INSTRUCTIONS MAY RESULT IN THE CANCELLATION OF YOUR SURGERY PATIENT SIGNATURE_________________________________  NURSE SIGNATURE__________________________________  ________________________________________________________________________  WHAT IS A BLOOD TRANSFUSION? Blood Transfusion Information  A transfusion is the replacement of blood or some of its parts. Blood is made up of multiple cells which provide different functions.  Red blood cells carry oxygen and are used for blood loss replacement.  White blood cells fight against  infection.  Platelets control bleeding.  Plasma helps clot blood.  Other blood products are available for specialized needs, such as hemophilia or other clotting disorders. BEFORE THE TRANSFUSION  Who gives blood for transfusions?   Healthy volunteers who are fully evaluated to make sure their blood is safe. This is blood bank blood. Transfusion therapy is the safest it has ever been in the practice of medicine. Before blood is taken from a donor, a complete history is taken to make sure that person has no history of diseases nor engages in risky social behavior (examples are intravenous drug use or sexual activity with multiple partners). The donor's travel history is screened to minimize risk of transmitting infections, such as malaria. The donated blood is tested for signs of infectious diseases, such as HIV and hepatitis. The blood is then tested to be sure it is compatible with you in order to minimize the chance of a transfusion reaction. If you or a relative donates blood, this is often done in anticipation of surgery and is not appropriate for emergency situations. It takes many days to process the donated blood. RISKS AND COMPLICATIONS Although transfusion therapy is very safe and saves many lives, the main dangers of transfusion include:   Getting an infectious disease.  Developing a transfusion reaction. This is an allergic reaction to something in the blood you were given. Every precaution is taken to prevent this. The decision to have a blood transfusion has been considered carefully by your caregiver before blood is given. Blood is not given unless the benefits outweigh the risks. AFTER THE TRANSFUSION  Right after receiving a blood transfusion, you will usually feel much better and more energetic.  This is especially true if your red blood cells have gotten low (anemic). The transfusion raises the level of the red blood cells which carry oxygen, and this usually causes an energy  increase.  The nurse administering the transfusion will monitor you carefully for complications. HOME CARE INSTRUCTIONS  No special instructions are needed after a transfusion. You may find your energy is better. Speak with your caregiver about any limitations on activity for underlying diseases you may have. SEEK MEDICAL CARE IF:   Your condition is not improving after your transfusion.  You develop redness or irritation at the intravenous (IV) site. SEEK IMMEDIATE MEDICAL CARE IF:  Any of the following symptoms occur over the next 12 hours:  Shaking chills.  You have a temperature by mouth above 102 F (38.9 C), not controlled by medicine.  Chest, back, or muscle pain.  People around you feel you are not acting correctly or are confused.  Shortness of breath or difficulty breathing.  Dizziness and fainting.  You get a rash or develop hives.  You have a decrease in urine output.  Your urine turns a dark color or changes to pink, red, or brown. Any of the following symptoms occur over the next 10 days:  You have a temperature by mouth above 102 F (38.9 C), not controlled by medicine.  Shortness of breath.  Weakness after normal activity.  The white part of the eye turns yellow (jaundice).  You have a decrease in the amount of urine or are urinating less often.  Your urine turns a dark color or changes to pink, red, or brown. Document Released: 11/03/2000 Document Revised: 01/29/2012 Document Reviewed: 06/22/2008 [facility] Main Patient Information 2014 [place], [place].  _______________________________________________________________________

## 2017-06-11 ENCOUNTER — Encounter (HOSPITAL_COMMUNITY): Payer: Self-pay

## 2017-06-11 ENCOUNTER — Encounter (HOSPITAL_COMMUNITY)
Admission: RE | Admit: 2017-06-11 | Discharge: 2017-06-11 | Disposition: A | Discharge status: Home / Self Care | Payer: BC Managed Care – PPO | Source: Ambulatory Visit | Attending: Urology | Admitting: Urology

## 2017-06-11 DIAGNOSIS — I10 Essential (primary) hypertension: Secondary | ICD-10-CM | POA: Diagnosis not present

## 2017-06-11 DIAGNOSIS — Z01812 Encounter for preprocedural laboratory examination: Secondary | ICD-10-CM | POA: Diagnosis present

## 2017-06-11 DIAGNOSIS — Z01810 Encounter for preprocedural cardiovascular examination: Secondary | ICD-10-CM | POA: Diagnosis present

## 2017-06-11 DIAGNOSIS — N8110 Cystocele, unspecified: Secondary | ICD-10-CM | POA: Insufficient documentation

## 2017-06-11 DIAGNOSIS — N393 Stress incontinence (female) (male): Secondary | ICD-10-CM | POA: Insufficient documentation

## 2017-06-11 HISTORY — DX: Unspecified temporomandibular joint disorder, unspecified side: M26.609

## 2017-06-11 HISTORY — DX: Other symptoms and signs involving the genitourinary system: R39.89

## 2017-06-11 HISTORY — DX: Migraine, unspecified, not intractable, without status migrainosus: G43.909

## 2017-06-11 LAB — ABO/RH: ABO/RH(D): A POS

## 2017-06-11 LAB — CBC
HEMATOCRIT: 42.1 % (ref 36.0–46.0)
Hemoglobin: 14.2 g/dL (ref 12.0–15.0)
MCH: 30.7 pg (ref 26.0–34.0)
MCHC: 33.7 g/dL (ref 30.0–36.0)
MCV: 90.9 fL (ref 78.0–100.0)
PLATELETS: 237 10*3/uL (ref 150–400)
RBC: 4.63 MIL/uL (ref 3.87–5.11)
RDW: 13.5 % (ref 11.5–15.5)
WBC: 7.7 10*3/uL (ref 4.0–10.5)

## 2017-06-11 LAB — BASIC METABOLIC PANEL
Anion gap: 10 (ref 5–15)
BUN: 16 mg/dL (ref 6–20)
CALCIUM: 9.3 mg/dL (ref 8.9–10.3)
CHLORIDE: 102 mmol/L (ref 101–111)
CO2: 26 mmol/L (ref 22–32)
CREATININE: 0.82 mg/dL (ref 0.44–1.00)
GFR calc non Af Amer: >60 mL/min (ref >60)
Glucose, Bld: 106 mg/dL — ABNORMAL HIGH (ref 65–99)
Potassium: 3.3 mmol/L — ABNORMAL LOW (ref 3.5–5.1)
SODIUM: 138 mmol/L (ref 135–145)

## 2017-06-11 LAB — PROTIME-INR
INR: 0.94
PROTHROMBIN TIME: 12.5 s (ref 11.4–15.2)

## 2017-06-11 LAB — APTT: APTT: 26 s (ref 24–36)

## 2017-06-11 NOTE — Progress Notes (Signed)
Patient refused to sign informed surgical consent at pre-op appt. Patient states that at her last office visit with MacDiarmid last week, she made it evident that she did not want the sling procedure. At pre-op patient states she does not want the sling because she does not want to go home home with a foley catheter. Surgical consent still contains sling procedure. RN advised patient to contact MacDiarmid office to determine how she should proceed. RN will defer signing of consent for morning of surgery in case patient does not get in touch with surgeon before day of surgery .

## 2017-06-12 ENCOUNTER — Other Ambulatory Visit (HOSPITAL_COMMUNITY): Payer: BC Managed Care – PPO

## 2017-06-12 ENCOUNTER — Other Ambulatory Visit: Payer: Self-pay | Admitting: Urology

## 2017-06-18 ENCOUNTER — Other Ambulatory Visit: Payer: Self-pay | Admitting: Obstetrics and Gynecology

## 2017-06-18 NOTE — Progress Notes (Signed)
Left message with Dr Loraine LericheMark Anderson's office to place [facility] 06/19/17 surgery.

## 2017-06-18 NOTE — Anesthesia Preprocedure Evaluation (Addendum)
Anesthesia Evaluation  Patient identified by MRN, date of birth, ID band Patient awake    Reviewed: Allergy & Precautions, NPO status , Patient's Chart, lab work & pertinent test results, reviewed documented beta blocker date and time   Airway Mallampati: II  TM Distance: >3 FB Neck ROM: Full    Dental  (+) Dental Advisory Given   Pulmonary neg pulmonary ROS,    breath sounds clear to auscultation       Cardiovascular hypertension, Pt. on medications and Pt. on home beta blockers  Rhythm:Regular Rate:Normal     Neuro/Psych  Headaches,    GI/Hepatic negative GI ROS, Neg liver ROS,   Endo/Other  negative endocrine ROS  Renal/GU negative Renal ROS     Musculoskeletal negative musculoskeletal ROS (+)   Abdominal   Peds  Hematology negative hematology ROS (+)   Anesthesia Other Findings   Reproductive/Obstetrics                            Lab Results  Component Value Date   WBC 7.7 06/11/2017   HGB 14.2 06/11/2017   HCT 42.1 06/11/2017   MCV 90.9 06/11/2017   PLT 237 06/11/2017   Lab Results  Component Value Date   CREATININE 0.82 06/11/2017   BUN 16 06/11/2017   NA 138 06/11/2017   K 3.3 (L) 06/11/2017   CL 102 06/11/2017   CO2 26 06/11/2017    Anesthesia Physical Anesthesia Plan  ASA: II  Anesthesia Plan: General   Post-op Pain Management:    Induction: Intravenous  PONV Risk Score and Plan: 4 or greater and Ondansetron, Dexamethasone, Midazolam, Scopolamine patch - Pre-op and Treatment may vary due to age or medical condition  Airway Management Planned: Oral ETT  Additional Equipment:   Intra-op Plan:   Post-operative Plan: Extubation in OR  Informed Consent: I have reviewed the patients History and Physical, chart, labs and discussed the procedure including the risks, benefits and alternatives for the proposed anesthesia with the patient or authorized  representative who has indicated his/her understanding and acceptance.   Dental advisory given  Plan Discussed with: CRNA  Anesthesia Plan Comments:        Anesthesia Quick Evaluation

## 2017-06-19 ENCOUNTER — Encounter (HOSPITAL_COMMUNITY)
Admission: RE | Disposition: A | Discharge status: Home / Self Care | Payer: Self-pay | Source: Ambulatory Visit | Attending: Urology

## 2017-06-19 ENCOUNTER — Encounter (HOSPITAL_COMMUNITY): Payer: Self-pay | Admitting: *Deleted

## 2017-06-19 ENCOUNTER — Ambulatory Visit (HOSPITAL_COMMUNITY): Payer: BC Managed Care – PPO | Admitting: Anesthesiology

## 2017-06-19 ENCOUNTER — Observation Stay (HOSPITAL_COMMUNITY)
Admission: RE | Admit: 2017-06-19 | Discharge: 2017-06-20 | Disposition: A | Discharge status: Home / Self Care | Payer: BC Managed Care – PPO | Source: Ambulatory Visit | Attending: Urology | Admitting: Urology

## 2017-06-19 DIAGNOSIS — N993 Prolapse of vaginal vault after hysterectomy: Secondary | ICD-10-CM | POA: Diagnosis not present

## 2017-06-19 DIAGNOSIS — N814 Uterovaginal prolapse, unspecified: Secondary | ICD-10-CM | POA: Diagnosis present

## 2017-06-19 DIAGNOSIS — N8111 Cystocele, midline: Secondary | ICD-10-CM | POA: Diagnosis present

## 2017-06-19 DIAGNOSIS — Z7982 Long term (current) use of aspirin: Secondary | ICD-10-CM | POA: Insufficient documentation

## 2017-06-19 DIAGNOSIS — N393 Stress incontinence (female) (male): Secondary | ICD-10-CM | POA: Diagnosis not present

## 2017-06-19 DIAGNOSIS — Z79899 Other long term (current) drug therapy: Secondary | ICD-10-CM | POA: Diagnosis not present

## 2017-06-19 HISTORY — PX: CYSTOSCOPY: SHX5120

## 2017-06-19 HISTORY — PX: VAGINAL HYSTERECTOMY: SHX2639

## 2017-06-19 HISTORY — PX: ANTERIOR AND POSTERIOR REPAIR: SHX5121

## 2017-06-19 LAB — TYPE AND SCREEN
ABO/RH(D): A POS
Antibody Screen: NEGATIVE

## 2017-06-19 LAB — HEMOGLOBIN AND HEMATOCRIT, BLOOD
HEMATOCRIT: 38.5 % (ref 36.0–46.0)
HEMOGLOBIN: 13.3 g/dL (ref 12.0–15.0)

## 2017-06-19 SURGERY — CYSTOSCOPY
Anesthesia: General

## 2017-06-19 MED ORDER — HYDROMORPHONE HCL-NACL 0.5-0.9 MG/ML-% IV SOSY
PREFILLED_SYRINGE | INTRAVENOUS | Status: AC
  Administered 2017-06-19: 0.5 mg via INTRAVENOUS
  Filled 2017-06-19: qty 1

## 2017-06-19 MED ORDER — ROCURONIUM BROMIDE 50 MG/5ML IV SOSY
PREFILLED_SYRINGE | INTRAVENOUS | Status: AC
  Filled 2017-06-19: qty 5

## 2017-06-19 MED ORDER — MORPHINE SULFATE (PF) 2 MG/ML IV SOLN
2.0000 mg | INTRAVENOUS | Status: DC | PRN
  Administered 2017-06-19 (×2): 2 mg via INTRAVENOUS
  Administered 2017-06-19 (×3): 4 mg via INTRAVENOUS
  Administered 2017-06-20 (×2): 2 mg via INTRAVENOUS
  Filled 2017-06-19 (×2): qty 1
  Filled 2017-06-19: qty 2
  Filled 2017-06-19 (×2): qty 1
  Filled 2017-06-19 (×2): qty 2

## 2017-06-19 MED ORDER — LIDOCAINE 2% (20 MG/ML) 5 ML SYRINGE
INTRAMUSCULAR | Status: AC
  Filled 2017-06-19: qty 5

## 2017-06-19 MED ORDER — MIDAZOLAM HCL 2 MG/2ML IJ SOLN
INTRAMUSCULAR | Status: AC
  Filled 2017-06-19: qty 2

## 2017-06-19 MED ORDER — CIPROFLOXACIN IN D5W 400 MG/200ML IV SOLN
INTRAVENOUS | Status: AC
  Filled 2017-06-19: qty 200

## 2017-06-19 MED ORDER — MIDAZOLAM HCL 5 MG/5ML IJ SOLN
INTRAMUSCULAR | Status: DC | PRN
  Administered 2017-06-19: 2 mg via INTRAVENOUS

## 2017-06-19 MED ORDER — DEXAMETHASONE SODIUM PHOSPHATE 10 MG/ML IJ SOLN
INTRAMUSCULAR | Status: AC
  Filled 2017-06-19: qty 1

## 2017-06-19 MED ORDER — ESTRADIOL 0.1 MG/GM VA CREA
TOPICAL_CREAM | VAGINAL | Status: DC | PRN
  Administered 2017-06-19: 2 via VAGINAL

## 2017-06-19 MED ORDER — HYDROCODONE-ACETAMINOPHEN 5-325 MG PO TABS
1.0000 | ORAL_TABLET | ORAL | Status: DC | PRN
  Administered 2017-06-20: 2 via ORAL
  Administered 2017-06-20: 1 via ORAL
  Filled 2017-06-19: qty 2
  Filled 2017-06-19: qty 1
  Filled 2017-06-19: qty 2

## 2017-06-19 MED ORDER — LIDOCAINE-EPINEPHRINE (PF) 1 %-1:200000 IJ SOLN
INTRAMUSCULAR | Status: AC
  Filled 2017-06-19: qty 60

## 2017-06-19 MED ORDER — FENTANYL CITRATE (PF) 100 MCG/2ML IJ SOLN
INTRAMUSCULAR | Status: AC
  Filled 2017-06-19: qty 2

## 2017-06-19 MED ORDER — ROCURONIUM BROMIDE 10 MG/ML (PF) SYRINGE
PREFILLED_SYRINGE | INTRAVENOUS | Status: DC | PRN
  Administered 2017-06-19: 20 mg via INTRAVENOUS
  Administered 2017-06-19: 50 mg via INTRAVENOUS
  Administered 2017-06-19: 10 mg via INTRAVENOUS
  Administered 2017-06-19 (×2): 20 mg via INTRAVENOUS

## 2017-06-19 MED ORDER — PHENYLEPHRINE 40 MCG/ML (10ML) SYRINGE FOR IV PUSH (FOR BLOOD PRESSURE SUPPORT)
PREFILLED_SYRINGE | INTRAVENOUS | Status: DC | PRN
  Administered 2017-06-19: 80 ug via INTRAVENOUS

## 2017-06-19 MED ORDER — SODIUM CHLORIDE 0.9 % IV SOLN
INTRAVENOUS | Status: AC
  Filled 2017-06-19: qty 500000

## 2017-06-19 MED ORDER — LIDOCAINE 2% (20 MG/ML) 5 ML SYRINGE
INTRAMUSCULAR | Status: DC | PRN
  Administered 2017-06-19: 100 mg via INTRAVENOUS

## 2017-06-19 MED ORDER — SUCCINYLCHOLINE CHLORIDE 200 MG/10ML IV SOSY
PREFILLED_SYRINGE | INTRAVENOUS | Status: DC | PRN
  Administered 2017-06-19: 20 mg via INTRAVENOUS

## 2017-06-19 MED ORDER — SCOPOLAMINE 1 MG/3DAYS TD PT72
MEDICATED_PATCH | TRANSDERMAL | Status: AC
  Filled 2017-06-19: qty 1

## 2017-06-19 MED ORDER — CEFAZOLIN SODIUM-DEXTROSE 2-4 GM/100ML-% IV SOLN
2.0000 g | INTRAVENOUS | Status: AC
  Administered 2017-06-19: 2 g via INTRAVENOUS

## 2017-06-19 MED ORDER — PHENAZOPYRIDINE HCL 200 MG PO TABS
200.0000 mg | ORAL_TABLET | ORAL | Status: AC
  Administered 2017-06-19: 200 mg via ORAL
  Filled 2017-06-19: qty 1

## 2017-06-19 MED ORDER — LACTATED RINGERS IV SOLN
INTRAVENOUS | Status: DC
  Administered 2017-06-19: 13:00:00 via INTRAVENOUS

## 2017-06-19 MED ORDER — SUCCINYLCHOLINE CHLORIDE 200 MG/10ML IV SOSY
PREFILLED_SYRINGE | INTRAVENOUS | Status: AC
  Filled 2017-06-19: qty 10

## 2017-06-19 MED ORDER — CEFAZOLIN SODIUM-DEXTROSE 2-4 GM/100ML-% IV SOLN
INTRAVENOUS | Status: AC
  Filled 2017-06-19: qty 100

## 2017-06-19 MED ORDER — SCOPOLAMINE 1 MG/3DAYS TD PT72
MEDICATED_PATCH | TRANSDERMAL | Status: DC | PRN
  Administered 2017-06-19: 1.5 mg via TRANSDERMAL

## 2017-06-19 MED ORDER — METOPROLOL SUCCINATE ER 50 MG PO TB24
50.0000 mg | ORAL_TABLET | Freq: Every day | ORAL | Status: DC
  Administered 2017-06-20: 50 mg via ORAL
  Filled 2017-06-19: qty 1

## 2017-06-19 MED ORDER — LACTATED RINGERS IV SOLN
INTRAVENOUS | Status: DC | PRN
  Administered 2017-06-19 (×3): via INTRAVENOUS

## 2017-06-19 MED ORDER — PROMETHAZINE HCL 25 MG/ML IJ SOLN: 6.2500 mg | INTRAMUSCULAR | Status: DC | PRN

## 2017-06-19 MED ORDER — DIPHENHYDRAMINE HCL 12.5 MG/5ML PO ELIX: 12.5000 mg | ORAL_SOLUTION | Freq: Four times a day (QID) | ORAL | Status: DC | PRN

## 2017-06-19 MED ORDER — FENTANYL CITRATE (PF) 100 MCG/2ML IJ SOLN
INTRAMUSCULAR | Status: DC | PRN
  Administered 2017-06-19 (×4): 50 ug via INTRAVENOUS

## 2017-06-19 MED ORDER — ACETAMINOPHEN 325 MG PO TABS
650.0000 mg | ORAL_TABLET | ORAL | Status: DC | PRN
  Administered 2017-06-20: 650 mg via ORAL
  Filled 2017-06-19: qty 2

## 2017-06-19 MED ORDER — ONDANSETRON HCL 4 MG/2ML IJ SOLN
INTRAMUSCULAR | Status: AC
  Filled 2017-06-19: qty 2

## 2017-06-19 MED ORDER — DEXAMETHASONE SODIUM PHOSPHATE 10 MG/ML IJ SOLN
INTRAMUSCULAR | Status: DC | PRN
  Administered 2017-06-19: 10 mg via INTRAVENOUS

## 2017-06-19 MED ORDER — HYDROMORPHONE HCL-NACL 0.5-0.9 MG/ML-% IV SOSY
0.2500 mg | PREFILLED_SYRINGE | INTRAVENOUS | Status: DC | PRN
  Administered 2017-06-19 (×2): 0.5 mg via INTRAVENOUS

## 2017-06-19 MED ORDER — HYDROCHLOROTHIAZIDE 25 MG PO TABS
25.0000 mg | ORAL_TABLET | Freq: Every day | ORAL | Status: DC
  Administered 2017-06-19 – 2017-06-20 (×2): 25 mg via ORAL
  Filled 2017-06-19 (×2): qty 1

## 2017-06-19 MED ORDER — PROPOFOL 10 MG/ML IV BOLUS
INTRAVENOUS | Status: DC | PRN
  Administered 2017-06-19: 200 mg via INTRAVENOUS

## 2017-06-19 MED ORDER — DEXTROSE-NACL 5-0.45 % IV SOLN
INTRAVENOUS | Status: DC
  Administered 2017-06-19 – 2017-06-20 (×2): via INTRAVENOUS

## 2017-06-19 MED ORDER — STERILE WATER FOR IRRIGATION IR SOLN
Status: DC | PRN
  Administered 2017-06-19: 3000 mL

## 2017-06-19 MED ORDER — FLUORESCEIN SODIUM 10 % IV SOLN
INTRAVENOUS | Status: AC
  Filled 2017-06-19: qty 5

## 2017-06-19 MED ORDER — LIDOCAINE HCL 2 % EX GEL
CUTANEOUS | Status: AC
  Filled 2017-06-19: qty 5

## 2017-06-19 MED ORDER — SUGAMMADEX SODIUM 200 MG/2ML IV SOLN
INTRAVENOUS | Status: AC
  Filled 2017-06-19: qty 2

## 2017-06-19 MED ORDER — ONDANSETRON HCL 4 MG/2ML IJ SOLN
4.0000 mg | INTRAMUSCULAR | Status: DC | PRN
Start: 2017-06-19 — End: 2017-06-20

## 2017-06-19 MED ORDER — CIPROFLOXACIN IN D5W 400 MG/200ML IV SOLN
400.0000 mg | INTRAVENOUS | Status: AC
  Administered 2017-06-19: 400 mg via INTRAVENOUS

## 2017-06-19 MED ORDER — ESTRADIOL 0.1 MG/GM VA CREA
TOPICAL_CREAM | VAGINAL | Status: AC
  Filled 2017-06-19: qty 85

## 2017-06-19 MED ORDER — SUGAMMADEX SODIUM 200 MG/2ML IV SOLN
INTRAVENOUS | Status: DC | PRN
  Administered 2017-06-19: 200 mg via INTRAVENOUS

## 2017-06-19 MED ORDER — ONDANSETRON HCL 4 MG/2ML IJ SOLN
INTRAMUSCULAR | Status: DC | PRN
  Administered 2017-06-19: 4 mg via INTRAVENOUS

## 2017-06-19 MED ORDER — SODIUM CHLORIDE 0.9 % IV SOLN
INTRAVENOUS | Status: DC | PRN
  Administered 2017-06-19: 500 mL

## 2017-06-19 MED ORDER — DIPHENHYDRAMINE HCL 50 MG/ML IJ SOLN: 12.5000 mg | Freq: Four times a day (QID) | INTRAMUSCULAR | Status: DC | PRN

## 2017-06-19 MED ORDER — LIDOCAINE-EPINEPHRINE (PF) 1 %-1:200000 IJ SOLN
INTRAMUSCULAR | Status: DC | PRN
  Administered 2017-06-19: 20 mL
  Administered 2017-06-19: 10 mL
  Administered 2017-06-19: 20 mL

## 2017-06-19 MED ORDER — INDIGOTINDISULFONATE SODIUM 8 MG/ML IJ SOLN
INTRAMUSCULAR | Status: AC
  Filled 2017-06-19: qty 5

## 2017-06-19 MED ORDER — HYDROCODONE-ACETAMINOPHEN 5-325 MG PO TABS: 1.0000 | ORAL_TABLET | Freq: Four times a day (QID) | ORAL | 0 refills | Status: DC | PRN

## 2017-06-19 MED ORDER — PHENYLEPHRINE 40 MCG/ML (10ML) SYRINGE FOR IV PUSH (FOR BLOOD PRESSURE SUPPORT)
PREFILLED_SYRINGE | INTRAVENOUS | Status: AC
  Filled 2017-06-19: qty 10

## 2017-06-19 MED ORDER — PROPOFOL 10 MG/ML IV BOLUS
INTRAVENOUS | Status: AC
  Filled 2017-06-19: qty 20

## 2017-06-19 SURGICAL SUPPLY — 65 items
BAG URINE DRAINAGE (UROLOGICAL SUPPLIES) ×4 IMPLANT
BAG URO CATCHER STRL LF (MISCELLANEOUS) ×4 IMPLANT
BLADE SURG 15 STRL LF DISP TIS (BLADE) ×2 IMPLANT
BLADE SURG 15 STRL SS (BLADE) ×2
CATH FOLEY 2WAY SLVR  5CC 14FR (CATHETERS) ×2
CATH FOLEY 2WAY SLVR 5CC 14FR (CATHETERS) ×2 IMPLANT
CLOTH BEACON ORANGE TIMEOUT ST (SAFETY) ×4 IMPLANT
COVER MAYO STAND STRL (DRAPES) ×8 IMPLANT
COVER SURGICAL LIGHT HANDLE (MISCELLANEOUS) ×8 IMPLANT
DECANTER SPIKE VIAL GLASS SM (MISCELLANEOUS) ×4 IMPLANT
DEVICE CAPIO SLIM SINGLE (INSTRUMENTS) ×4 IMPLANT
DRAIN PENROSE 18X1/4 LTX STRL (WOUND CARE) ×4 IMPLANT
DRAPE SHEET LG 3/4 BI-LAMINATE (DRAPES) ×4 IMPLANT
ELECT PENCIL ROCKER SW 15FT (MISCELLANEOUS) ×4 IMPLANT
GAUZE PACKING 2X5 YD STRL (GAUZE/BANDAGES/DRESSINGS) ×8 IMPLANT
GAUZE SPONGE 4X4 16PLY XRAY LF (GAUZE/BANDAGES/DRESSINGS) ×20 IMPLANT
GLOVE BIO SURGEON STRL SZ 6.5 (GLOVE) ×3 IMPLANT
GLOVE BIO SURGEONS STRL SZ 6.5 (GLOVE) ×1
GLOVE BIOGEL M STRL SZ7.5 (GLOVE) ×4 IMPLANT
GLOVE BIOGEL PI IND STRL 7.0 (GLOVE) ×2 IMPLANT
GLOVE BIOGEL PI INDICATOR 7.0 (GLOVE) ×2
GLOVE ECLIPSE 7.0 STRL STRAW (GLOVE) ×8 IMPLANT
GLOVE ECLIPSE 8.5 STRL (GLOVE) ×4 IMPLANT
GOWN STRL REUS W/TWL LRG LVL3 (GOWN DISPOSABLE) ×8 IMPLANT
GOWN STRL REUS W/TWL XL LVL3 (GOWN DISPOSABLE) ×4 IMPLANT
HEMOSTAT SURGICEL 4X8 (HEMOSTASIS) ×4 IMPLANT
HOLDER FOLEY CATH W/STRAP (MISCELLANEOUS) ×4 IMPLANT
IV NS 1000ML (IV SOLUTION) ×2
IV NS 1000ML BAXH (IV SOLUTION) ×2 IMPLANT
KIT BASIN OR (CUSTOM PROCEDURE TRAY) ×4 IMPLANT
NEEDLE HYPO 22GX1.5 SAFETY (NEEDLE) ×4 IMPLANT
NEEDLE MAYO 6 CRC TAPER PT (NEEDLE) ×4 IMPLANT
NEEDLE SPNL 18GX3.5 QUINCKE PK (NEEDLE) ×4 IMPLANT
NEEDLE SPNL 22GX3.5 QUINCKE BK (NEEDLE) IMPLANT
NS IRRIG 1000ML POUR BTL (IV SOLUTION) ×8 IMPLANT
PACK CYSTO (CUSTOM PROCEDURE TRAY) ×4 IMPLANT
PAD OB MATERNITY 4.3X12.25 (PERSONAL CARE ITEMS) ×4 IMPLANT
PLUG CATH AND CAP STER (CATHETERS) ×4 IMPLANT
RETRACTOR STAY HOOK 5MM (MISCELLANEOUS) ×4 IMPLANT
SHEET LAVH (DRAPES) ×4 IMPLANT
SPONGE LAP 18X18 X RAY DECT (DISPOSABLE) ×4 IMPLANT
SPONGE LAP 4X18 X RAY DECT (DISPOSABLE) ×4 IMPLANT
SUT CAPIO ETHIBPND (SUTURE) IMPLANT
SUT MNCRL 0 MO-4 VIOLET 18 CR (SUTURE) ×12 IMPLANT
SUT MNCRL 0 VIOLET 6X18 (SUTURE) ×2 IMPLANT
SUT MONOCRYL 0 6X18 (SUTURE) ×2
SUT MONOCRYL 0 MO 4 18  CR/8 (SUTURE) ×12
SUT VIC AB 0 CT1 27 (SUTURE) ×6
SUT VIC AB 0 CT1 27XBRD ANTBC (SUTURE) ×6 IMPLANT
SUT VIC AB 2-0 CT1 27 (SUTURE) ×10
SUT VIC AB 2-0 CT1 27XBRD (SUTURE) ×4 IMPLANT
SUT VIC AB 2-0 CT1 TAPERPNT 27 (SUTURE) ×6 IMPLANT
SUT VIC AB 2-0 SH 27 (SUTURE) ×10
SUT VIC AB 2-0 SH 27X BRD (SUTURE) ×10 IMPLANT
SUT VIC AB 3-0 SH 27 (SUTURE) ×4
SUT VIC AB 3-0 SH 27XBRD (SUTURE) ×4 IMPLANT
SUT VICRYL 0 UR6 27IN ABS (SUTURE) ×8 IMPLANT
SYR 10ML LL (SYRINGE) ×4 IMPLANT
TOWEL OR 17X26 10 PK STRL BLUE (TOWEL DISPOSABLE) ×8 IMPLANT
TOWEL OR NON WOVEN STRL DISP B (DISPOSABLE) ×8 IMPLANT
TRAY FOLEY CATH SILVER 14FR (SET/KITS/TRAYS/PACK) ×4 IMPLANT
TUBING CONNECTING 10 (TUBING) ×3 IMPLANT
TUBING CONNECTING 10' (TUBING) ×1
WATER STERILE IRR 1500ML POUR (IV SOLUTION) ×4 IMPLANT
YANKAUER SUCT BULB TIP 10FT TU (MISCELLANEOUS) ×4 IMPLANT

## 2017-06-19 NOTE — Anesthesia Procedure Notes (Signed)
Procedure Name: Intubation Date/Time: 06/19/2017 7:40 AM Performed by: Lind Covert Pre-anesthesia Checklist: Emergency Drugs available, Patient identified, Patient being monitored, Timeout performed and Suction available Patient Re-evaluated:Patient Re-evaluated prior to induction Oxygen Delivery Method: Circle system utilized Preoxygenation: Pre-oxygenation with 100% oxygen Induction Type: IV induction Laryngoscope Size: Mac and 4 Grade View: Grade I Tube type: Oral Tube size: 7.0 (intubated per [facility] paramedic student) mm Number of attempts: 1 Airway Equipment and Method: Stylet Placement Confirmation: ETT inserted through vocal cords under direct vision,  positive ETCO2 and breath sounds checked- equal and bilateral Secured at: 22 cm Tube secured with: Tape Dental Injury: Teeth and Oropharynx as per pre-operative assessment

## 2017-06-19 NOTE — Interval H&P Note (Signed)
History and Physical Interval Note:  06/19/2017 7:34 AM  Taylor Mullins  has presented today for surgery, with the diagnosis of RECTOCELE CYSTOCELE ,VAULT PROLASPE STRESS INCONTINANCE  The various methods of treatment have been discussed with the patient and family. After consideration of risks, benefits and other options for treatment, the patient has consented to  Procedure(s): CYSTOSCOPY (N/A) ANTERIOR (CYSTOCELE) AND POSTERIOR REPAIR (RECTOCELE) (N/A) VAGINAL VAULT REPAIR (N/A) HYSTERECTOMY VAGINAL TOTAL (N/A) BILATERAL SALPINGO OOPHORECTOMY (Bilateral) as a surgical intervention .  The patient's history has been reviewed, patient examined, no change in status, stable for surgery.  I have reviewed the patient's chart and labs.  Questions were answered to the patient's satisfaction.     Krissi Willaims A

## 2017-06-19 NOTE — Progress Notes (Signed)
Taylor Mullins is an 56 y.o. white female,who presents to the OR for a TVH possible BSO for symptomatic prolapse. She states that the problem began several yrs after a forcep delivery. 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 She understands that she will have a BSO if anatomy is accessible Past Medical History:  Diagnosis Date  . Essential hypertension   . Ganglion cyst    right ankle  . Migraine    occasionally   . TMJ dysfunction   . Urinary problem     Past Surgical History:  Procedure Laterality Date  . ABLATION    . CESAREAN SECTION  [phone]    Family History  Problem Relation Age of Onset  . Colon cancer Mother   . Hypertension Mother   . Hyperlipidemia Mother   . Crohn's disease Father   . Heart disease Father    Social History:  reports that she has never smoked. She has never used smokeless tobacco. She reports that she drinks alcohol. She reports that she does not use drugs.  Allergies:  Allergies  Allergen Reactions  . 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    Lips swell and mouth; only occurs with shrimp and millusk ; able to eat crabs and lobster with no issue      (Not in a hospital admission)     There were no vitals taken for this visit. General appearance: alert and cooperative Lungs: clear to auscultation bilaterally Heart: regular rate and rhythm, S1, S2 normal, no murmur, click, rub or gallop Abdomen: soft, non-tender; bowel sounds normal; no masses,  no organomegaly Pelvic: cervix normal in appearance, external genitalia normal, no adnexal masses or tenderness, no cervical motion tenderness, uterus normal size, shape, and consistency and 2 degree prolapse and moderate sized rectocele and cystocele   Lab Results  Component Value Date   WBC 7.7 06/11/2017   HGB 14.2 06/11/2017   HCT 42.1 06/11/2017   MCV 90.9 06/11/2017   PLT 237 06/11/2017   Lab Results  Component Value Date   PREGTESTUR  03/14/2011    NEGATIVE         THE SENSITIVITY OF THIS METHODOLOGY IS >24 mIU/mL    Patient Active Problem List   Diagnosis Date Noted  . Preventative health care 12/19/2016  . Essential hypertension 09/18/2016  . Recurrent UTI 09/18/2016   Imp/ Symptomatic prolapse Plan/ TVH with possible BSO ANDERSON,MARK E 06/19/2017, 7:29 AM

## 2017-06-19 NOTE — Transfer of Care (Signed)
Immediate Anesthesia Transfer of Care Note  Patient: Taylor Mullins  Procedure(s) Performed: Procedure(s): CYSTOSCOPY (N/A) ANTERIOR (CYSTOCELE) AND POSTERIOR REPAIR (RECTOCELE) (N/A) HYSTERECTOMY VAGINAL TOTAL (N/A)  Patient Location: PACU  Anesthesia Type:General  Level of Consciousness: sedated  Airway & Oxygen Therapy: Patient Spontanous Breathing and Patient connected to face mask oxygen  Post-op Assessment: Report given to RN and Post -op Vital signs reviewed and stable  Post vital signs: Reviewed and stable  Last Vitals:  Vitals:   06/19/17 0552  BP: (!) 169/81  Pulse: 91  Resp: 18  Temp: 36.8 C    Last Pain:  Vitals:   06/19/17 0552  TempSrc: Oral      Patients Stated Pain Goal: 3 (06/19/17 0612)  Complications: No apparent anesthesia complications

## 2017-06-19 NOTE — Anesthesia Postprocedure Evaluation (Signed)
Anesthesia Post Note  Patient: Taylor Mullins  Procedure(s) Performed: Procedure(s) (LRB): CYSTOSCOPY (N/A) ANTERIOR (CYSTOCELE) AND POSTERIOR REPAIR (RECTOCELE) (N/A) HYSTERECTOMY VAGINAL TOTAL (N/A)     Patient location during evaluation: PACU Anesthesia Type: General Level of consciousness: awake and alert Pain management: pain level controlled Vital Signs Assessment: post-procedure vital signs reviewed and stable Respiratory status: spontaneous breathing, nonlabored ventilation, respiratory function stable and patient connected to nasal cannula oxygen Cardiovascular status: blood pressure returned to baseline and stable Postop Assessment: no signs of nausea or vomiting Anesthetic complications: no    Last Vitals:  Vitals:   06/19/17 1245 06/19/17 1305  BP: 123/65 125/77  Pulse: 79 80  Resp: 15 16  Temp: 36.8 C 36.7 C    Last Pain:  Vitals:   06/19/17 1305  TempSrc:   PainSc: 8                  Kennieth RadFitzgerald, Oisin Yoakum E

## 2017-06-19 NOTE — H&P (Signed)
I was consulted by the above provider to assist the patient's urinary incontinence and prolapse. She has been told she has an cystocele rectocele and she is planning a hysterectomy. For many years she has worsening incontinence. She sometimes leaks with coughing sneezing and bending and lifting. Sometimes when she is walking at school she can leak a little bit. Her bladder has become much more urgent and she has been doing bathroom mapping. Sometimes she could have her urge incontinence. She infrequently has mild bedwetting and wears one liner during the night and one during the day. 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 She gets up once at night. She reported good flow. She describes a lot of trauma during one of her childbirths   Recently she's had more urinary tract infections. She can feel vaginal bulging. She does not do splinting maneuvers.   At rest the patient had a grade 3 cystocele with moderate central defect. The cystocele reached almost to the introitus. Her cervix descended from 8 or 9 cm to approximate 6 cm but she could not cough that hard. She had a modest grade 2 rectocele. She did not have stress incontinence associated with a hypermobility of the bladder neck   The patient has mild her mixed incontinence. She has mild nocturia. She has mild intermittent enuresis. She has prolapse symptoms. A picture was drawn. If the patient never had surgery she would likely best benefit from a transvaginal hysterectomy and vault suspension and graft. A rectocele repair would likely be prudent based upon her age and its modest size. I'm not certain if Dr. Dareen PianoAnderson plans on doing the A&P repair. This will be clarified. She will return for urodynamics to assess her incontinence.   The patient's bladder capacity was 350 mL. Bladder was unstable reaching pressures of 6 cm of water and she did not leak. She did not leak with Valsalva pressure 120 cm of water.  This was with and without the prolapse reduced. During voluntary voiding she voided 337 mL with maximal 413 mils per second.  Voiding pressure was 40 cm of water. She emptied efficiently. EMG activity was normal during the voiding phase. Bladder neck descended 2-3 cm and she had a s large cystocele fluoroscopically.   Operative picture was drawn. Prolapse surgery as described was discussed. I counseled her as though I was doing the prolapse repair.  Sling and watchful waiting and pessary were discussed. Mesh issues were discussed.   The patient was a little bit nervous during her urodynamics and I think felt overwhelmed today. She would like her incontinence treated but she understands that all of the surgery may not be worth it. We decided to remove myrbetriq samples and prescription to behavioral therapy and regroup in 1 month.   Today  Patient never took the medications that she is decided that prolapse surgery. I will correct the template from the last dictation and for the second time now I drew a picture and went over the prolapse surgery described and a sling. She understands that I would normally not offer a sling for her mild her mixed incontinence. Once again she was very nervous and worried. She asked many good questions answered. Mesh issues described. She says she can live with her incontinence and she understands the risk of worsening. She has decided not to have the sling and I think in her circumstances this is best because she is very worried especially  above the self-catheterization. A delayed sling can always be done. Several months after surgery I will try to help her with medical and behavioral therapy.   I did politely share with her that I felt a bit concerned about her nervousness which was also present last time.   ALLERGIES: Shrimp   MEDICATIONS: Hydrochlorothiazide  Metoprolol Tartrate  Myrbetriq 50 mg tablet, extended release 24 hr 1 tablet PO Daily    GU PSH: Complex  cystometrogram, w/ void pressure and urethral pressure profile studies, any technique - 02/15/2017 Complex Uroflow - 02/15/2017 D&C Non-OB Emg surf Electrd - 02/15/2017 Inject For cystogram - 02/15/2017 Intrabd voidng Press - 02/15/2017   NON-GU PSH: C-Section   GU PMH: Mixed incontinence - 02/09/2017 Nocturia - 02/09/2017 Nocturnal Enuresis - 02/09/2017   NON-GU PMH: Hypertension   FAMILY HISTORY: 1 Daughter - Daughter 2 sons - Son Colon Cancer - Mother Crohn's Disease - Father kidney stone - Mother   SOCIAL HISTORY: Marital Status: Married Current Smoking Status: Patient has never smoked.   Tobacco Use Assessment Completed:  Used Tobacco in last 30 days?  Social Drinker.  Drinks 2 caffeinated drinks per day. 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   REVIEW OF SYSTEMS:    GU Review Female:   Patient reports frequent urination, get up at night to urinate, and leakage of urine. Patient denies hard to postpone urination, burning /pain with urination, stream starts and stops, trouble starting your stream, have to strain to urinate, and being pregnant.  Gastrointestinal (Upper):   Patient denies nausea, vomiting, and indigestion/ heartburn.  Gastrointestinal (Lower):   Patient denies diarrhea and constipation.  Constitutional:   Patient denies fever, night sweats, weight loss, and fatigue.  Skin:   Patient denies skin rash/ lesion and itching.  Eyes:   Patient denies blurred vision and double vision.  Ears/ Nose/ Throat:   Patient denies sinus problems and sore throat.  Hematologic/Lymphatic:   Patient denies swollen glands and easy bruising.  Cardiovascular:   Patient denies leg swelling and chest pains.  Respiratory:   Patient denies cough and shortness of breath.  Endocrine:   Patient denies excessive thirst.  Musculoskeletal:   Patient denies back pain and joint pain.  Neurological:   Patient denies headaches and dizziness.  Psychologic:   Patient denies depression and anxiety.    VITAL SIGNS:      05/29/2017 02:39 PM  Weight 210 lb / 95.25 kg  Height 68.5 in / 173.99 cm  BP 147/88 mmHg  Pulse 86 /min  Temperature 98.0 F / 37 C  BMI 31.5 kg/m   PAST DATA REVIEWED:  Source Of History:  Patient   PROCEDURES:         Urinalysis w/Scope Dipstick Dipstick Cont'd Micro  Color: Yellow Bilirubin: Neg WBC/hpf: 6 - 10/hpf  Appearance: Clear Ketones: Neg RBC/hpf: 0 - 2/hpf  Specific Gravity: 1.025 Blood: Trace Bacteria: Rare (0-9/hpf)  pH: <=5.0 Protein: Neg Cystals: NS (Not Seen)  Glucose: Neg Urobilinogen: 0.2 Casts: Hyaline    Nitrites: Neg Trichomonas: Not Present    Leukocyte Esterase: Trace Mucous: Not Present      Epithelial Cells: 0 - 5/hpf      Yeast: NS (Not Seen)      Sperm: Not Present   ASSESSMENT:      ICD-10 Details  1 GU:   Mixed incontinence - N39.46   2   Nocturia - R35.1               Notes:  I drew her a picture and we talked about prolapse surgery in detail. Pros, cons, general surgical and anesthetic risks, and other options including behavioral therapy, pessaries, and watchful waiting were discussed. She understands that prolapse repairs are successful in 80-85% of cases for prolapse symptoms and can recur anteriorly, posteriorly, and/or apically. She understands that in most cases I use a graft and general risks were discussed. Surgical risks were described but not limited to the discussion of injury to neighboring structures including the bowel (with possible life-threatening sepsis and colostomy), bladder, urethra, vagina (all resulting in further surgery), and ureter (resulting in re-implantation). We talked about injury to nerves/soft tissue leading to debilitating and intractable pelvic, abdominal, and lower extremity pain syndromes and neuropathies. The risks of buttock pain, intractable dyspareunia, and vaginal narrowing and shortening with sequelae were discussed. Bleeding risks, transfusion rates, and infection were discussed. The  risk of persistent, de novo, or worsening bladder and/or bowel incontinence/dysfunction was discussed. The need for CIC was described as well the usual post-operative course. The patient understands that she might not reach her treatment goal and that she might be worse following surgery.   We talked about a sling in detail. Pros, cons, general surgical and anesthetic risks, and other options including behavioral therapy and watchful waiting were discussed. She understands that slings are generally successful in 90% of cases for stress incontinence, 50% for urge incontinence, and that in a small % of cases the incontinence can worsen. The risk of persistent, de novo, or worsening incontinence/dysfunction was discussed. Risks were described but not limited to the discussion of injury to neighboring structures including the bowel (with possible life-threatening sepsis and colostomy), bladder, urethra, vagina (all resulting in further surgery), and ureter (resulting in re-implantation). We also talked about the risk of retention requiring urethrolysis, extrusion requiring revision, and erosion resulting in further surgery. Bleeding risks and transfusion rates and the risk of infection were discussed. The risk of pelvic and abdominal pain syndromes, dyspareunia, and neuropathies were discussed. The need for CIC was described as well the usual post-operative course. The patient understands that she might not reach her treatment goal and that she might be worse following surgery.   After a thorough review of the management options for the patient's condition the patient  elected to proceed with surgical therapy as noted above. We have discussed the potential benefits and risks of the procedure, side effects of the proposed treatment, the likelihood of the patient achieving the goals of the procedure, and any potential problems that might occur during the procedure or recuperation. Informed consent has been  obtained.

## 2017-06-19 NOTE — Plan of Care (Signed)
Problem: Nutrition: Goal: Adequate nutrition will be maintained Outcome: Progressing Started on clear liquids, tolerating well. Will continue to advance as tolerated.

## 2017-06-19 NOTE — Progress Notes (Signed)
Looks good Felt nausea with sitting  Labs pending Bit sore s/pare Urine looks good in foley Continues to be a bit nervous Surgery discussed

## 2017-06-19 NOTE — Progress Notes (Unsigned)
Taylor Mullins is an 56 y.o.G3P3 white female,who presents for a TVH possible BSO for symptomatic prolapse. She states that she began to have symptoms a couple yrs after having a forcep delivery for a large baby. She also has mixed incontience and a symptomatic cystocele and rectocele. These problems will be addressed by Dr. McDermitt. Chief Complaint: HPI:  Past Medical History:  Diagnosis Date  . Essential hypertension   . Ganglion cyst    right ankle  . Migraine    occasionally   . TMJ dysfunction   . Urinary problem     Past Surgical History:  Procedure Laterality Date  . ABLATION    . CESAREAN SECTION  [id number]    Family History  Problem Relation Age of Onset  . Colon cancer Mother   . Hypertension Mother   . Hyperlipidemia Mother   . Crohn's disease Father   . Heart disease Father    Social History:  reports that she has never smoked. She has never used smokeless tobacco. She reports that she drinks alcohol. She reports that she does not use drugs.  Allergies:  Allergies  Allergen Reactions  . 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    Lips swell and mouth; only occurs with shrimp and millusk ; able to eat crabs and lobster with no issue      (Not in a hospital admission)     There were no vitals taken for this visit. General appearance: alert and cooperative Lungs: clear to auscultation bilaterally Heart: regular rate and rhythm, S1, S2 normal, no murmur, click, rub or gallop Abdomen: soft, non-tender; bowel sounds normal; no masses,  no organomegaly Pelvic: cervix normal in appearance, external genitalia normal, no adnexal masses or tenderness, no cervical motion tenderness, uterus normal size, shape, and consistency, vagina normal without discharge and pt with 2nd degree prolapse and a moderate cystocele and rectocele   Lab Results  Component Value Date   WBC 7.7 06/11/2017   HGB 14.2 06/11/2017   HCT 42.1 06/11/2017   MCV 90.9 06/11/2017   PLT 237  06/11/2017   Lab Results  Component Value Date   PREGTESTUR  03/14/2011    NEGATIVE        THE SENSITIVITY OF THIS METHODOLOGY IS >24 mIU/mL    Patient Active Problem List   Diagnosis Date Noted  . Preventative health care 12/19/2016  . Essential hypertension 09/18/2016  . Recurrent UTI 09/18/2016   IMP/ /Symptomatic prolapse Plan/ TVH possible BSO  Taylor Mullins 06/19/2017, 7:10 AM

## 2017-06-19 NOTE — Op Note (Signed)
Preoperative diagnosis: Cystocele and rectocele and mild vault prolapse Postoperative diagnosis: Cystocele and rectocele and mild vault prolapse Surgery: Cystocele repair and rectocele repair and cystoscopy Surgeon: Dr. Lorin PicketScott Aurilla Coulibaly Asst. Marchelle FolksAmanda dancy  The patient has the above diagnoses and consented to the above procedure. Gynecology had performed a hysterectomy and left the ovaries. Both ureteral sacral ligaments were tagged. The posterior cuff was run. Hemostasis was good. Leg position was good.  She had a moderate grade 2 cystocele with central defect and a moderate grade 2 rectocele. She had a capacious vagina. Posteriorly it was more of a flatter defect. The ureteral sacral ligaments were very well supported and she did not need a vault suspension  20 mL of a lidocaine epinephrine mixture was utilized anteriorly and then again posteriorly. With my Allis clamps at the cuff I made the anterior vaginal wall incision from the cuff to the proximal urethra. I sharply dissected the overlying vaginal wall mucosa from the underlying pubocervical fascia to the white line bilaterally. I mobilized well at the apex recognizing the tagged ligaments. I did an anterior repair with running 2-0 Vicryl on an SH needle. I did not distort the anatomy. I kept good length.  I cystoscoped the patient. Cystoscopically she had excellent repair and no bladder injury. Ureters were excellent with good yellow jets bilaterally.  I closed an appropriate amount of anterior vaginal wall and close anterior vaginal wall with running 2-0 Vicryl on a CT1 needle.  I closed the vaginal cuff from left to right and right to left meeting in the midline with running 0 Vicryl. I had reapproximated during the repair the ureteral sacral ligaments. She had a very good repair and very good length with no narrowing  It was difficult to identify the hymenal ring. She had a very short perineal body. Between 2 Allis clamps appropriately  placed I removed a very small few millimeters of triangle of perineal skin. With my Allis technique I made a long posterior incision all the way approaching the cuff. I sharply and bluntly mobilized the thin vaginal wall mucosa from the underlying rectovaginal fascia to the sidewall. I was happy with the mobilization apically. I did a digital rectal examination and she had diffuse weakness and a very wide flatter rectocele that needed an imbrication in the midline. I did a 2 layer posterior repair with running 2-0 Vicryl suture. I kept good length and was a good anatomic repair. Repeat rectal examination demonstrated no rectal injury and a very good repair.  I trimmed only a few millimeters of posterior vaginal wall mucosa and closed with running 2-0 Vicryl and SH needle. Because of her anatomy I actually used interrupted sutures at the perineal body and was very diligent not to narrow her at the introitus at 6:00. There was not a lot of soft tissue to bring medially. I did 1 gentle perineal repair with a 0 Vicryl interrupted. I was very pleased with the closure.  Blood loss was less than 100 mL. One piece of Surgicel was used. Vaginal packs were applied with estrogen cream. Leg position was good. Urine output was good. The patient had very good length. She had no narrowing. She did not have a ski jump the fact at 6:00 at the introitus. Hopefully the operation will reach her treatment goal

## 2017-06-20 ENCOUNTER — Encounter (HOSPITAL_COMMUNITY): Payer: Self-pay | Admitting: Urology

## 2017-06-20 DIAGNOSIS — N993 Prolapse of vaginal vault after hysterectomy: Secondary | ICD-10-CM | POA: Diagnosis not present

## 2017-06-20 LAB — BASIC METABOLIC PANEL
ANION GAP: 12 (ref 5–15)
BUN: 7 mg/dL (ref 6–20)
CALCIUM: 8.7 mg/dL — AB (ref 8.9–10.3)
CO2: 26 mmol/L (ref 22–32)
Chloride: 100 mmol/L — ABNORMAL LOW (ref 101–111)
Creatinine, Ser: 0.78 mg/dL (ref 0.44–1.00)
GLUCOSE: 132 mg/dL — AB (ref 65–99)
Potassium: 3.3 mmol/L — ABNORMAL LOW (ref 3.5–5.1)
Sodium: 138 mmol/L (ref 135–145)

## 2017-06-20 LAB — HEMOGLOBIN AND HEMATOCRIT, BLOOD
HCT: 37.6 % (ref 36.0–46.0)
Hemoglobin: 12.3 g/dL (ref 12.0–15.0)

## 2017-06-20 NOTE — Progress Notes (Signed)
POD#1 Pt states that she is doing well. Tolerating diet and pain. She still has foley in place. Discussed surgery and findings. VSSAF IMP/ POD#1 doing well.  Plan/Will attempt to discharge to home today.

## 2017-06-20 NOTE — Discharge Summary (Signed)
Date of admission: 06/19/2017  Date of discharge: 06/20/2017  Admission diagnosis: Cystocele  Discharge diagnosis: Cystocele  Secondary diagnoses: uterine prolapse and rectocele  History and Physical: For full details, please see admission history and physical. Briefly, Taylor Mullins is a 56 y.o. year old patient with the above diagnosis.   Hospital Course: Hysterectomy and A and P repair; excellent post op course  Laboratory values:  Recent Labs  06/19/17 1525 06/20/17 0452  HGB 13.3 12.3  HCT 38.5 37.6    Recent Labs  06/20/17 0452  CREATININE 0.78    Disposition: Home  Discharge instruction: The patient was instructed to be ambulatory but told to refrain from heavy lifting, strenuous activity, or driving. Detailed  Discharge medications:  Allergies as of 06/20/2017      Reactions   Shrimp [shellfish Allergy] Anaphylaxis   Lips swell and mouth; only occurs with shrimp and millusk ; able to eat crabs and lobster with no issue       Medication List    STOP taking these medications   aspirin EC 81 MG tablet     TAKE these medications   cetirizine 10 MG tablet Commonly known as:  ZYRTEC Take 10 mg by mouth every evening.   hydrochlorothiazide 25 MG tablet Commonly known as:  HYDRODIURIL TAKE 1 TABLET BY MOUTH EVERY MORNING TO CONTROL HIGH BLOOD PRESSURE   HYDROcodone-acetaminophen 5-325 MG tablet Commonly known as:  NORCO Take 1-2 tablets by mouth every 6 (six) hours as needed for moderate pain or severe pain.   metoprolol succinate 50 MG 24 hr tablet Commonly known as:  TOPROL-XL TAKE 1 TABLET BY MOUTH DAILY TO CONTROL BLOOD PRESSURE       Followup:  Follow-up Information    Izzah Pasqua, Lorin PicketScott, MD Follow up.   Specialty:  Urology Why:  office will call you with date and time of appt.  Contact information: [address] [place] [address] [phone]        Alfredo MartinezMacDiarmid, Tranise Forrest, MD Follow up.   Specialty:  Urology Contact information: [address] [place] [address] [phone]

## 2017-06-20 NOTE — Progress Notes (Signed)
06/20/17  1300  Reviewed discharge instructions with patient. Patient verbalized understanding of discharge instructions. Copy of discharge instructions and prescription given to patient.

## 2017-06-20 NOTE — Discharge Instructions (Signed)
I have reviewed discharge instructions in detail with the patient. They will follow-up with me or their physician as scheduled. My nurse will also be calling the patients as per protocol. As discussed with Dr. Cornell Gaber. ° °You may resume aspirin, advil, aleve, vitamins, and supplements 7 days after surgery. °

## 2017-06-20 NOTE — Progress Notes (Signed)
Vitals normal Labs noted Little to no pain and feels a lot better Post op detailed  Send home today pending am

## 2017-06-21 NOTE — Op Note (Signed)
NAMFlorentina Jenny:  Mullins, Taylor              ACCOUNT NO.:  [id number]  MEDICAL RECORD NO.:  [id number]  LOCATION:                                 FACILITY:  PHYSICIAN:  Malva LimesMark Anderson, M.D.    DATE OF BIRTH:  09-22-1961  DATE OF PROCEDURE:  06/19/2017 DATE OF DISCHARGE:                              OPERATIVE REPORT   PREOPERATIVE DIAGNOSIS:  Symptomatic prolapse.  POSTOPERATIVE DIAGNOSIS:  Symptomatic prolapse.  PROCEDURE:  Total vaginal hysterectomy.  SURGEON:  Malva LimesMark Anderson, M.D.  ASSISTANT:  Arlyce DiceKaplan.  ANESTHESIA:  General and local.  ANTIBIOTICS:  Ancef 2 g.  DRAINS:  Foley to bedside drainage.  ESTIMATED BLOOD LOSS:  100 mL.  SPECIMENS:  Cervix and uterus sent to Pathology.  COMPLICATIONS:  None.  DESCRIPTION OF PROCEDURE:  The patient was taken to the operating room where she was placed in dorsal supine position.  A general anesthetic was administered without difficulty.  She was then placed in a dorsal lithotomy position.  She was prepped and draped in the usual fashion for this procedure.  An exam under anesthesia revealed a large cystocele and rectocele and a first-degree prolapse.  The patient had a weighted speculum placed in the vagina.  20 mL of 1% lidocaine with epinephrine was used for paracervical block.  The posterior cul-de-sac was then entered sharply.  The uterosacral ligaments were bilaterally clamped, cut, and ligated with 0 Monocryl suture.  The cervix was then circumscribed.  The bladder pillars were bilaterally clamped, cut, and ligated with 0 Monocryl suture.  There was dense scar in the anterior cul-de-sac from the previous 2 cesarean sections.  This was transected with the Metzenbaum scissors.  On completion of this, the anterior cul- de-sac was entered.  Cardinal ligaments were then serially clamped, cut, and ligated with 0 Monocryl suture.  The uterine arteries were bilaterally clamped, cut, and ligated with 0 Monocryl suture.  Once the level of  the triple pedicle was reached, the fallopian tube, ovarian ligament, and round ligament were clamped, cut, and ligated bilaterally. The specimen was then removed.  Pedicles were ligated using 0 Monocryl sutures x2.  Pedicles were all felt to be hemostatic.  An attempt to perform bilateral salpingo-oophorectomy was unsuccessful as visualization was not possible.  Following this, posterior cuff was closed using 2-0 Vicryl suture in a running locking fashion.  The patient was going to have a repair of her anterior and posterior fascial defects by Dr. Alfredo MartinezScott MacDiarmid, this will be dictated by him.  After conclusion of my procedure, Dr. Sherron MondayMacDiarmid entered the room and completed the case.    ______________________________ Malva LimesMark Anderson, M.D.   ______________________________ Malva LimesMark Anderson, M.D.    MA/MEDQ  D:  06/20/2017  T:  06/20/2017  Job:  [id number]

## 2017-06-25 ENCOUNTER — Telehealth: Payer: Self-pay | Admitting: Primary Care

## 2017-06-25 DIAGNOSIS — I10 Essential (primary) hypertension: Secondary | ICD-10-CM

## 2017-06-25 MED ORDER — METOPROLOL TARTRATE 25 MG PO TABS: 25.0000 mg | ORAL_TABLET | Freq: Two times a day (BID) | ORAL | 1 refills | Status: DC

## 2017-06-25 NOTE — Telephone Encounter (Signed)
Received faxed request from [facility]. Patient is requesting new Rx for metoprolol tartrate or atenolol to replace Toprol XL. Cheaper for patient.

## 2017-06-25 NOTE — Telephone Encounter (Signed)
Noted. Rx for Metoprolol tartrate 25 mg BID sent to pharmacy. This will replace the metoprolol succinate 50 mg (Toprol XL) once daily. Please have her monitor her BP and report readings at or above 135/90.

## 2017-06-26 NOTE — Telephone Encounter (Signed)
Spoken and notified patient of Kate's comments. Patient verbalized understanding. 

## 2017-08-21 ENCOUNTER — Other Ambulatory Visit: Payer: Self-pay | Admitting: Primary Care

## 2017-08-21 DIAGNOSIS — E785 Hyperlipidemia, unspecified: Secondary | ICD-10-CM

## 2017-08-23 ENCOUNTER — Other Ambulatory Visit (INDEPENDENT_AMBULATORY_CARE_PROVIDER_SITE_OTHER): Payer: BC Managed Care – PPO

## 2017-08-23 DIAGNOSIS — E785 Hyperlipidemia, unspecified: Secondary | ICD-10-CM | POA: Diagnosis not present

## 2017-08-24 LAB — LIPID PANEL
CHOLESTEROL: 226 mg/dL — AB (ref 0–200)
HDL: 62.9 mg/dL (ref >39.00)
LDL Cholesterol: 123 mg/dL — ABNORMAL HIGH (ref 0–99)
NonHDL: 163.48
Total CHOL/HDL Ratio: 4
Triglycerides: 200 mg/dL — ABNORMAL HIGH (ref 0.0–149.0)
VLDL: 40 mg/dL (ref 0.0–40.0)

## 2017-09-18 ENCOUNTER — Other Ambulatory Visit: Payer: BC Managed Care – PPO

## 2017-11-05 ENCOUNTER — Encounter: Payer: Self-pay | Admitting: Family Medicine

## 2017-11-05 ENCOUNTER — Ambulatory Visit: Payer: BC Managed Care – PPO | Admitting: Family Medicine

## 2017-11-05 VITALS — BP 130/80 | HR 83 | Temp 97.8°F | Wt 214.2 lb

## 2017-11-05 DIAGNOSIS — R05 Cough: Secondary | ICD-10-CM

## 2017-11-05 DIAGNOSIS — R058 Other specified cough: Secondary | ICD-10-CM

## 2017-11-05 NOTE — Patient Instructions (Addendum)
You can take Delsym for your cough.  It can be found over the counter at the pharmacy.  Please let us know if you are not feeling better or are becoming worse before the end of the week. Cough, Adult Coughing is a reflex that clears your throat and your airways. Coughing helps to heal and protect your lungs. It is normal to cough occasionally, but a cough that happens with other symptoms or lasts a long time may be a sign of a condition that needs treatment. A cough may last only 2-3 weeks (acute), or it may last longer than 8 weeks (chronic). What are the causes? Coughing is commonly caused by:  Breathing in substances that irritate your lungs.  A viral or bacterial respiratory infection.  Allergies.  Asthma.  Postnasal drip.  Smoking.  Acid backing up from the stomach into the esophagus (gastroesophageal reflux).  Certain medicines.  Chronic lung problems, including COPD (or rarely, lung cancer).  Other medical conditions such as heart failure.  Follow these instructions at home: Pay attention to any changes in your symptoms. Take these actions to help with your discomfort:  Take medicines only as told by your health care provider. ? If you were prescribed an antibiotic medicine, take it as told by your health care provider. Do not stop taking the antibiotic even if you start to feel better. ? Talk with your health care provider before you take a cough suppressant medicine.  Drink enough fluid to keep your urine clear or pale yellow.  If the air is dry, use a cold steam vaporizer or humidifier in your bedroom or your home to help loosen secretions.  Avoid anything that causes you to cough at work or at home.  If your cough is worse at night, try sleeping in a semi-upright position.  Avoid cigarette smoke. If you smoke, quit smoking. If you need help quitting, ask your health care provider.  Avoid caffeine.  Avoid alcohol.  Rest as needed.  Contact a health care  provider if:  You have new symptoms.  You cough up pus.  Your cough does not get better after 2-3 weeks, or your cough gets worse.  You cannot control your cough with suppressant medicines and you are losing sleep.  You develop pain that is getting worse or pain that is not controlled with pain medicines.  You have a fever.  You have unexplained weight loss.  You have night sweats. Get help right away if:  You cough up blood.  You have difficulty breathing.  Your heartbeat is very fast. This information is not intended to replace advice given to you by your health care provider. Make sure you discuss any questions you have with your health care provider. Document Released: 05/05/2011 Document Revised: 04/13/2016 Document Reviewed: 01/13/2015 Elsevier Interactive Patient Education  2017 [employer]

## 2017-11-05 NOTE — Progress Notes (Signed)
Subjective:    Patient ID: Taylor Mullins, female    DOB: 09/14/1961, 56 y.o.   MRN: [id number]  Chief Complaint  Patient presents with  . Cough    HPI Patient was seen today for acute concern.  Mostly dry cough x 2 wks. Pt also endorses barely runny nose and sore throat but attributes it to her cough.  Pt states she is beginning to feel better or her symptoms have not completely resolved.  Patient has not tried any medications, but has been using a humidifier at home.  Sick contacts include patient's fifth grade class.  Patient is concerned about being around her elderly parents for the holidays given her illness.  Past Medical History:  Diagnosis Date  . Essential hypertension   . Ganglion cyst    right ankle  . Migraine    occasionally   . TMJ dysfunction   . Urinary problem     Allergies  Allergen Reactions  . 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    Lips swell and mouth; only occurs with shrimp and millusk ; able to eat crabs and lobster with no issue     ROS General: Denies fever, chills, night sweats, changes in weight, changes in appetite HEENT: Denies headaches, ear pain, changes in vision, rhinorrhea + sore throat CV: Denies CP, palpitations, SOB, orthopnea Pulm: Denies SOB, wheezing   + cough GI: Denies abdominal pain, nausea, vomiting, diarrhea, constipation GU: Denies dysuria, hematuria, frequency, vaginal discharge Msk: Denies muscle cramps, joint pains Neuro: Denies weakness, numbness, tingling Skin: Denies rashes, bruising Psych: Denies depression, anxiety, hallucinations     Objective:    Blood pressure 130/80, pulse 83, temperature 97.8 F (36.6 C), temperature source Oral, weight 214 lb 3.2 oz (97.2 kg).   Gen. Pleasant, well-nourished, in no distress, normal affect   HEENT: [place]/AT, face symmetric, no scleral icterus, PERRLA,nares patent without drainage, pharynx with mild erythema, no exudate. Lungs: no accessory muscle use, CTAB, no wheezes or  rales Cardiovascular: RRR, no m/r/g, no peripheral edema Neuro:  A&Ox3, CN II-XII intact, normal gait   Wt Readings from Last 3 Encounters:  11/05/17 214 lb 3.2 oz (97.2 kg)  06/19/17 215 lb 9.6 oz (97.8 kg)  06/11/17 215 lb 9.6 oz (97.8 kg)      Assessment/Plan:  Post-viral cough syndrome  -discussed likely course -Supportive care, discussed using Delsym -Patient given RTC precautions.   F/u prn

## 2017-12-18 ENCOUNTER — Other Ambulatory Visit: Payer: Self-pay | Admitting: Primary Care

## 2017-12-18 DIAGNOSIS — I10 Essential (primary) hypertension: Secondary | ICD-10-CM

## 2017-12-22 ENCOUNTER — Other Ambulatory Visit: Payer: Self-pay | Admitting: Primary Care

## 2017-12-22 DIAGNOSIS — I10 Essential (primary) hypertension: Secondary | ICD-10-CM

## 2017-12-24 ENCOUNTER — Ambulatory Visit (INDEPENDENT_AMBULATORY_CARE_PROVIDER_SITE_OTHER): Payer: BC Managed Care – PPO | Admitting: Primary Care

## 2017-12-24 ENCOUNTER — Encounter: Payer: Self-pay | Admitting: Primary Care

## 2017-12-24 VITALS — BP 118/76 | HR 80 | Temp 98.2°F | Ht 68.0 in | Wt 218.5 lb

## 2017-12-24 DIAGNOSIS — N8111 Cystocele, midline: Secondary | ICD-10-CM | POA: Diagnosis not present

## 2017-12-24 DIAGNOSIS — N390 Urinary tract infection, site not specified: Secondary | ICD-10-CM | POA: Diagnosis not present

## 2017-12-24 DIAGNOSIS — I10 Essential (primary) hypertension: Secondary | ICD-10-CM

## 2017-12-24 DIAGNOSIS — Z0000 Encounter for general adult medical examination without abnormal findings: Secondary | ICD-10-CM | POA: Diagnosis not present

## 2017-12-24 NOTE — Patient Instructions (Addendum)
Contact the endoscopy center in regards to your repeat colonoscopy.  Follow up with your Gynecologist as scheduled.  Schedule a lab only appointment to return fasting for labs. You may have water and black coffee, no food four hours prior.  Start exercising. You should be getting 150 minutes of moderate intensity exercise weekly.  It's important to improve your diet by reducing consumption of fast food, fried food, processed snack foods, sugary drinks. Increase consumption of fresh vegetables and fruits, whole grains, water.  Ensure you are drinking 64 ounces of water daily.  Follow up in 1 year for your annual exam or sooner if needed.  It was a pleasure to see you today!    Preventive Care 40-64 Years, Female Preventive care refers to lifestyle choices and visits with your health care provider that can promote health and wellness. What does preventive care include?  A yearly physical exam. This is also called an annual well check.  Dental exams once or twice a year.  Routine eye exams. Ask your health care provider how often you should have your eyes checked.  Personal lifestyle choices, including: ? Daily care of your teeth and gums. ? Regular physical activity. ? Eating a healthy diet. ? Avoiding tobacco and drug use. ? Limiting alcohol use. ? Practicing safe sex. ? Taking low-dose aspirin daily starting at age 62. ? Taking vitamin and mineral supplements as recommended by your health care provider. What happens during an annual well check? The services and screenings done by your health care provider during your annual well check will depend on your age, overall health, lifestyle risk factors, and family history of disease. Counseling Your health care provider may ask you questions about your:  Alcohol use.  Tobacco use.  Drug use.  Emotional well-being.  Home and relationship well-being.  Sexual activity.  Eating habits.  Work and work  Statistician.  Method of birth control.  Menstrual cycle.  Pregnancy history.  Screening You may have the following tests or measurements:  Height, weight, and BMI.  Blood pressure.  Lipid and cholesterol levels. These may be checked every 5 years, or more frequently if you are over 10 years old.  Skin check.  Lung cancer screening. You may have this screening every year starting at age 31 if you have a 30-pack-year history of smoking and currently smoke or have quit within the past 15 years.  Fecal occult blood test (FOBT) of the stool. You may have this test every year starting at age 80.  Flexible sigmoidoscopy or colonoscopy. You may have a sigmoidoscopy every 5 years or a colonoscopy every 10 years starting at age 7.  Hepatitis C blood test.  Hepatitis B blood test.  Sexually transmitted disease (STD) testing.  Diabetes screening. This is done by checking your blood sugar (glucose) after you have not eaten for a while (fasting). You may have this done every 1-3 years.  Mammogram. This may be done every 1-2 years. Talk to your health care provider about when you should start having regular mammograms. This may depend on whether you have a family history of breast cancer.  BRCA-related cancer screening. This may be done if you have a family history of breast, ovarian, tubal, or peritoneal cancers.  Pelvic exam and Pap test. This may be done every 3 years starting at age 68. Starting at age 40, this may be done every 5 years if you have a Pap test in combination with an HPV test.  Bone density scan.  This is done to screen for osteoporosis. You may have this scan if you are at high risk for osteoporosis.  Discuss your test results, treatment options, and if necessary, the need for more tests with your health care provider. Vaccines Your health care provider may recommend certain vaccines, such as:  Influenza vaccine. This is recommended every year.  Tetanus,  diphtheria, and acellular pertussis (Tdap, Td) vaccine. You may need a Td booster every 10 years.  Varicella vaccine. You may need this if you have not been vaccinated.  Zoster vaccine. You may need this after age 82.  Measles, mumps, and rubella (MMR) vaccine. You may need at least one dose of MMR if you were born in 1957 or later. You may also need a second dose.  Pneumococcal 13-valent conjugate (PCV13) vaccine. You may need this if you have certain conditions and were not previously vaccinated.  Pneumococcal polysaccharide (PPSV23) vaccine. You may need one or two doses if you smoke cigarettes or if you have certain conditions.  Meningococcal vaccine. You may need this if you have certain conditions.  Hepatitis A vaccine. You may need this if you have certain conditions or if you travel or work in places where you may be exposed to hepatitis A.  Hepatitis B vaccine. You may need this if you have certain conditions or if you travel or work in places where you may be exposed to hepatitis B.  Haemophilus influenzae type b (Hib) vaccine. You may need this if you have certain conditions.  Talk to your health care provider about which screenings and vaccines you need and how often you need them. This information is not intended to replace advice given to you by your health care provider. Make sure you discuss any questions you have with your health care provider. Document Released: 12/03/2015 Document Revised: 07/26/2016 Document Reviewed: 09/07/2015 Elsevier Interactive Patient Education  [employer].

## 2017-12-24 NOTE — Assessment & Plan Note (Signed)
Immunizations UTD. Due for colonoscopy, she will call the endoscopy center to schedule. Will place referral to GI if needed, she'll notify. Mammogram due this Spring, she will follow up with GYN. Discussed the importance of a healthy diet and regular exercise in order for weight loss, and to reduce the risk of any potential medical problems. Exam unremarkable. Labs pending, she is not fasting today.  Follow up in 1 year.

## 2017-12-24 NOTE — Assessment & Plan Note (Signed)
No UTI since bladder tacking. History of cystocele.

## 2017-12-24 NOTE — Assessment & Plan Note (Signed)
Stable in the office today, continue HCTZ 25 mg and Lopressor 25 mg BID. BMP pending.

## 2017-12-24 NOTE — Assessment & Plan Note (Signed)
Underwent bladder tacking, also partial hysterectomy in July 2018.

## 2017-12-24 NOTE — Progress Notes (Signed)
Subjective:    Patient ID: Taylor Mullins, female    DOB: June 30, 1961, 56 y.o.   MRN: [id number]  HPI  Taylor Mullins is a 57 year old female who presents today for complete physical. She is following with dermatology and gynecology.   Immunizations: -Tetanus: Completed in 2015 -Influenza: Completed this season   Diet: She endorses a healthy diet.  Breakfast: Skips, egg and cheese biscuit Lunch: Soup, applesauce Dinner: Soup, sandwich, restaurants  Snacks: Pretzels, chips, tricuits  Desserts: Daily  Beverages: Chocolate milk, water, sweet tea, juice  Exercise: She is not currently exercising.  Eye exam: Completed 2 years ago. Due and will schedule. Dental exam: Completed recently. Colonoscopy: Completed in 2014, due 2019.  Pap Smear: Completed in 2017. Follows with GYN. Hysterectomy.  Mammogram: Follows with GYN.    Review of Systems  Constitutional: Negative for unexpected weight change.  HENT: Negative for rhinorrhea.   Respiratory: Negative for cough and shortness of breath.   Cardiovascular: Negative for chest pain.  Gastrointestinal: Negative for constipation and diarrhea.  Genitourinary: Negative for difficulty urinating and menstrual problem.  Musculoskeletal: Negative for arthralgias and myalgias.  Skin: Negative for rash.  Allergic/Immunologic: Negative for environmental allergies.  Neurological: Negative for dizziness, numbness and headaches.  Psychiatric/Behavioral: The patient is not nervous/anxious.        Past Medical History:  Diagnosis Date  . Essential hypertension   . Ganglion cyst    right ankle  . Migraine    occasionally   . TMJ dysfunction   . Urinary problem      Social History   Socioeconomic History  . Marital status: Married    Spouse name: Not on file  . Number of children: Not on file  . Years of education: Not on file  . Highest education level: Not on file  Social Needs  . Financial resource strain: Not on file  . Food  insecurity - worry: Not on file  . Food insecurity - inability: Not on file  . Transportation needs - medical: Not on file  . Transportation needs - non-medical: Not on file  Occupational History  . Not on file  Tobacco Use  . Smoking status: Never Smoker  . Smokeless tobacco: Never Used  Substance and Sexual Activity  . Alcohol use: Yes    Comment: occaisonally   . Drug use: No  . Sexual activity: Not on file  Other Topics Concern  . Not on file  Social History Narrative   Married.   3 children.    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     Past Surgical History:  Procedure Laterality Date  . ABLATION    . ANTERIOR AND POSTERIOR REPAIR N/A 06/19/2017   Procedure: ANTERIOR (CYSTOCELE) AND POSTERIOR REPAIR (RECTOCELE);  Surgeon: Alfredo Martinez, MD;  Location: [facility];  Service: Urology;  Laterality: N/A;  . CESAREAN SECTION  [id number]  . CYSTOSCOPY N/A 06/19/2017   Procedure: CYSTOSCOPY;  Surgeon: Alfredo Martinez, MD;  Location: [facility];  Service: Urology;  Laterality: N/A;  . VAGINAL HYSTERECTOMY N/A 06/19/2017   Procedure: HYSTERECTOMY VAGINAL TOTAL;  Surgeon: Levi Aland, MD;  Location: [facility];  Service: Gynecology;  Laterality: N/A;    Family History  Problem Relation Age of Onset  . Colon cancer Mother   . Hypertension Mother   . Hyperlipidemia Mother   . Crohn's disease Father   . Heart disease Father     Allergies  Allergen Reactions  . 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 cetirizine (ZYRTEC) 10 MG tablet Take 10 mg by mouth every evening.     . metoprolol tartrate (LOPRESSOR) 25 MG tablet TAKE 1 TABLET BY MOUTH TWICE A DAY 180 tablet 1  . hydrochlorothiazide (HYDRODIURIL) 25 MG tablet TAKE 1 TABLET BY MOUTH  EVERY MORNING TO CONTROL HIGH BLOOD PRESSURE 90 tablet 3   No current facility-administered medications on file prior to visit.     BP 118/76   Pulse 80   Temp 98.2 F (36.8 C) (Oral)   Ht 5\' 8"  (1.727 m)   Wt 218 lb 8 oz (99.1 kg)   SpO2 95%   BMI 33.22 kg/m    Objective:   Physical Exam  Constitutional: She is oriented to person, place, and time. She appears well-nourished.  HENT:  Right Ear: Tympanic membrane and ear canal normal.  Left Ear: Tympanic membrane and ear canal normal.  Nose: Nose normal.  Mouth/Throat: Oropharynx is clear and moist.  Eyes: Conjunctivae and EOM are normal. Pupils are equal, round, and reactive to light.  Neck: Neck supple. No thyromegaly present.  Cardiovascular: Normal rate and regular rhythm.  No murmur heard. Pulmonary/Chest: Effort normal and breath sounds normal. She has no rales.  Abdominal: Soft. Bowel sounds are normal. There is no tenderness.  Musculoskeletal: Normal range of motion.  Lymphadenopathy:    She has no cervical adenopathy.  Neurological: She is alert and oriented to person, place, and time. She has normal reflexes. No cranial nerve deficit.  Skin: Skin is warm and dry. No rash noted.  Psychiatric: She has a normal mood and affect.          Assessment & Plan:

## 2017-12-26 ENCOUNTER — Other Ambulatory Visit (INDEPENDENT_AMBULATORY_CARE_PROVIDER_SITE_OTHER): Payer: BC Managed Care – PPO

## 2017-12-26 DIAGNOSIS — Z0000 Encounter for general adult medical examination without abnormal findings: Secondary | ICD-10-CM | POA: Diagnosis not present

## 2017-12-27 LAB — LIPID PANEL
CHOL/HDL RATIO: 4
Cholesterol: 235 mg/dL — ABNORMAL HIGH (ref 0–200)
HDL: 57.4 mg/dL (ref >39.00)
NonHDL: 178.04
Triglycerides: 270 mg/dL — ABNORMAL HIGH (ref 0.0–149.0)
VLDL: 54 mg/dL — ABNORMAL HIGH (ref 0.0–40.0)

## 2017-12-27 LAB — COMPREHENSIVE METABOLIC PANEL
ALT: 20 U/L (ref 0–35)
AST: 21 U/L (ref 0–37)
Albumin: 4.6 g/dL (ref 3.5–5.2)
Alkaline Phosphatase: 72 U/L (ref 39–117)
BILIRUBIN TOTAL: 0.7 mg/dL (ref 0.2–1.2)
BUN: 19 mg/dL (ref 6–23)
CO2: 29 meq/L (ref 19–32)
CREATININE: 0.82 mg/dL (ref 0.40–1.20)
Calcium: 9.8 mg/dL (ref 8.4–10.5)
Chloride: 98 mEq/L (ref 96–112)
GFR: 76.71 mL/min (ref >60.00)
Glucose, Bld: 79 mg/dL (ref 70–99)
Potassium: 3.6 mEq/L (ref 3.5–5.1)
SODIUM: 138 meq/L (ref 135–145)
TOTAL PROTEIN: 7.9 g/dL (ref 6.0–8.3)

## 2017-12-27 LAB — LDL CHOLESTEROL, DIRECT: Direct LDL: 144 mg/dL

## 2017-12-28 ENCOUNTER — Encounter: Payer: Self-pay | Admitting: Primary Care

## 2017-12-28 DIAGNOSIS — E785 Hyperlipidemia, unspecified: Secondary | ICD-10-CM

## 2017-12-31 MED ORDER — ATORVASTATIN CALCIUM 20 MG PO TABS: 20.0000 mg | ORAL_TABLET | Freq: Every evening | ORAL | 1 refills | Status: DC

## 2018-01-11 ENCOUNTER — Ambulatory Visit: Payer: Self-pay | Admitting: *Deleted

## 2018-01-11 NOTE — Telephone Encounter (Signed)
Pt called because she is having hives all over her body except her head and feet; she says that this has been occurring for 30 years but it is probably stress related; pt states that she started atorvastatin on 12/31/17, and she has been taking benadryl by mouth and ointment without success; she is normally seen by Mayra ReelKate Clark but the pt would [facility] to [facility] after she gets off work, and [place]; no appointments available at [facility], [facility], or [facility] that will accommodate the pt's time constraints; pt also declined [facility] an [facility]; she states that she will try to use the home care advise given per nurse triage; she also states that if she does not get better over the weekend she will call and make an appointment on Monday; pt also declined making the appointment for Monday now.   Reason for Disposition . Hives or itching  Answer Assessment - Initial Assessment Questions 1. APPEARANCE: "What does the rash look like?"      Red hives 2. LOCATION: "Where is the rash located?"      all over body except head and feet 3. NUMBER: "How many hives are there?"      Too many to count 4. SIZE: "How big are the hives?" (inches, cm, compare to coins) "Do they all look the same or is there lots of variation in shape and size?"      Vary in shape and size 5. ONSET: "When did the hives begin?" (Hours or days ago)      01/03/18 6. ITCHING: "Does it itch?" If so, ask: "How bad is the itch?"    - MILD: doesn't interfere with normal activities   - MODERATE-SEVERE: interferes with work, school, sleep, or other activities      severe 7. RECURRENT PROBLEM: "Have you had hives before?" If so, ask: "When was the last time?" and "What happened that time?"      yes 8. TRIGGERS: "Were you exposed to any new food, plant, cosmetic product or animal just before the hives began?"     New cholesterol medication 9. OTHER SYMPTOMS: "Do you  have any other symptoms?" (e.g., fever, tongue swelling, difficulty breathing, abdominal pain)     no 10. PREGNANCY: "Is there any chance you are pregnant?" "When was your last menstrual period?"       No hysterectomy  Protocols used: RASH - WIDESPREAD ON DRUGS-A-AH, HIVES-A-AH

## 2018-01-13 ENCOUNTER — Encounter (HOSPITAL_COMMUNITY): Payer: Self-pay | Admitting: Emergency Medicine

## 2018-01-13 ENCOUNTER — Ambulatory Visit (HOSPITAL_COMMUNITY)
Admission: EM | Admit: 2018-01-13 | Discharge: 2018-01-13 | Disposition: A | Discharge status: Home / Self Care | Payer: BC Managed Care – PPO | Attending: Family Medicine | Admitting: Family Medicine

## 2018-01-13 DIAGNOSIS — L500 Allergic urticaria: Secondary | ICD-10-CM | POA: Diagnosis not present

## 2018-01-13 MED ORDER — PREDNISONE 10 MG (21) PO TBPK: ORAL_TABLET | Freq: Every day | ORAL | 0 refills | Status: DC

## 2018-01-13 MED ORDER — METHYLPREDNISOLONE SODIUM SUCC 125 MG IJ SOLR
125.0000 mg | Freq: Once | INTRAMUSCULAR | Status: AC
  Administered 2018-01-13: 125 mg via INTRAMUSCULAR

## 2018-01-13 MED ORDER — METHYLPREDNISOLONE SODIUM SUCC 125 MG IJ SOLR: 80.0000 mg | Freq: Once | INTRAMUSCULAR | Status: DC

## 2018-01-13 MED ORDER — METHYLPREDNISOLONE SODIUM SUCC 125 MG IJ SOLR
INTRAMUSCULAR | Status: AC
  Filled 2018-01-13: qty 2

## 2018-01-13 NOTE — Discharge Instructions (Addendum)
We gave you an injection of steroids today.   Please begin steroid taper tomorrow.  Please begin with 6 tablets then decrease by 1 tablet each day until completion.  You may also continue the allergy medicine recommended to you by your pharmacist.  Please do not take the atorvastatin again.  Follow-up with primary provider this week.

## 2018-01-13 NOTE — ED Provider Notes (Signed)
[facility]    CSN: [id number] Arrival date & time: 01/13/18  1209     History   Chief Complaint Chief Complaint  Patient presents with  . Urticaria    HPI Taylor Mullins is a 57 y.o. female history of hypertension, hyperlipidemia presenting today with concern for allergic reaction.  States she has had hives across her entire body for approximately 11 days, she began taking atorvastatin on February 11.  Hives began on February 14.  She states that she frequently has a few days of hives that typically resolve over the past few years.  She takes a daily Zyrtec because of this.  She has had allergy testing approximately 10 years ago and tested positive for dust, mold, shrimp.  She feels her hives are normally related to stress.  She has not been able to relate a new detergent, soap, food, medicine.  Although this time she did start a new medicine.  She stopped taking the atorvastatin 3 days ago, and has been taking chlorpheneramine to help with itching as Benadryl was not providing any relief.  She presents today because she woke up with swelling of lips and a tingling sensation inside of her mouth.  Denies shortness of breath or difficulty breathing.  Denies chest pain.  HPI  Past Medical History:  Diagnosis Date  . Essential hypertension   . Ganglion cyst    right ankle  . Migraine    occasionally   . TMJ dysfunction   . Urinary problem     Patient Active Problem List   Diagnosis Date Noted  . Cystocele, midline 06/19/2017  . Preventative health care 12/19/2016  . Essential hypertension 09/18/2016  . Recurrent UTI 09/18/2016    Past Surgical History:  Procedure Laterality Date  . ABLATION    . ANTERIOR AND POSTERIOR REPAIR N/A 06/19/2017   Procedure: ANTERIOR (CYSTOCELE) AND POSTERIOR REPAIR (RECTOCELE);  Surgeon: Alfredo Martinez, MD;  Location: [facility];  Service: Urology;  Laterality: N/A;  . CESAREAN SECTION  [phone]  . CYSTOSCOPY N/A 06/19/2017   Procedure:  CYSTOSCOPY;  Surgeon: Alfredo Martinez, MD;  Location: [facility];  Service: Urology;  Laterality: N/A;  . VAGINAL HYSTERECTOMY N/A 06/19/2017   Procedure: HYSTERECTOMY VAGINAL TOTAL;  Surgeon: Levi Aland, MD;  Location: [facility];  Service: Gynecology;  Laterality: N/A;    OB History    No data available       Home Medications    Prior to Admission medications   Medication Sig Start Date End Date Taking? Authorizing Provider  atorvastatin (LIPITOR) 20 MG tablet Take 1 tablet (20 mg total) by mouth every evening. 12/31/17   Doreene Nest, NP  cetirizine (ZYRTEC) 10 MG tablet Take 10 mg by mouth every evening.     [provider]  hydrochlorothiazide (HYDRODIURIL) 25 MG tablet TAKE 1 TABLET BY MOUTH EVERY MORNING TO CONTROL HIGH BLOOD PRESSURE 12/24/17   Doreene Nest, NP  metoprolol tartrate (LOPRESSOR) 25 MG tablet TAKE 1 TABLET BY MOUTH TWICE A DAY 12/19/17   Doreene Nest, NP  predniSONE (STERAPRED UNI-PAK 21 TAB) 10 MG (21) TBPK tablet Take by mouth daily. Begin 6 tablets day 1, 5 tablets day 2, 4 on day 3, 3 on day 4, 2 on day 5, 1 day 6 01/13/18   Wieters, [place] C, PA-C    Family History Family History  Problem Relation Age of Onset  . Colon cancer Mother   . Hypertension Mother   . Hyperlipidemia  Mother   . Crohn's disease Father   . Heart disease Father     Social History Social History   Tobacco Use  . Smoking status: Never Smoker  . Smokeless tobacco: Never Used  Substance Use Topics  . Alcohol use: Yes    Comment: occaisonally   . Drug use: No     Allergies   Shrimp [shellfish allergy]   Review of Systems Review of Systems  Constitutional: Negative for fever.  Eyes: Negative for visual disturbance.  Respiratory: Negative for chest tightness and shortness of breath.   Cardiovascular: Negative for chest pain.  Gastrointestinal: Negative for nausea and vomiting.  Musculoskeletal: Negative for arthralgias and myalgias.  Skin: Positive  for color change and rash.  Neurological: Negative for dizziness, weakness, light-headedness and headaches.     Physical Exam Triage Vital Signs ED Triage Vitals [01/13/18 1244]  Enc Vitals Group     BP (!) 150/84     Pulse Rate 92     Resp 20     Temp 98.2 F (36.8 C)     Temp Source Oral     SpO2 99 %     Weight      Height      Head Circumference      Peak Flow      Pain Score      Pain Loc      Pain Edu?      Excl. in GC?    No data found.  Updated Vital Signs BP (!) 150/84 (BP Location: Left Arm) Comment: Notified Tina  Pulse 92   Temp 98.2 F (36.8 C) (Oral)   Resp 20   SpO2 99%   Visual Acuity Right Eye Distance:   Left Eye Distance:   Bilateral Distance:    Right Eye Near:   Left Eye Near:    Bilateral Near:     Physical Exam  Constitutional: She appears well-developed and well-nourished. No distress.  HENT:  Head: Normocephalic and atraumatic.  Bilateral TMs nonerythematous, posterior oropharynx clear, physical mucosa without any sores or lesions, no erythema lips dry and cracked, do not appear swollen at this time.  Eyes: Conjunctivae are normal.  Neck: Neck supple.  Cardiovascular: Normal rate and regular rhythm.  No murmur heard. Pulmonary/Chest: Effort normal and breath sounds normal. No respiratory distress.  Breathing comfortably at rest, CTA BL  Musculoskeletal: She exhibits no edema.  Neurological: She is alert.  Erythematous maculopapular hive-like lesions across trunk, extending into legs and arms.  Skin: Skin is warm and dry.  Psychiatric: She has a normal mood and affect.  Nursing note and vitals reviewed.    UC Treatments / Results  Labs (all labs ordered are listed, but only abnormal results are displayed) Labs Reviewed - No data to display  EKG  EKG Interpretation None       Radiology No results found.  Procedures Procedures (including critical care time)  Medications Ordered in UC Medications    methylPREDNISolone sodium succinate (SOLU-MEDROL) 125 mg/2 mL injection 125 mg (125 mg Intramuscular Given 01/13/18 1312)     Initial Impression / Assessment and Plan / UC Course  I have reviewed the triage vital signs and the nursing notes.  Pertinent labs & imaging results that were available during my care of the patient were reviewed by me and considered in my medical decision making (see chart for details).     Will provide Solu-Medrol today, steroid taper pack over the next 6 days.  Do  not continue with atorvastatin, follow-up with PCP this week.  May continue the chlorpheniramine. Discussed strict return precautions. Patient verbalized understanding and is agreeable with plan.   Final Clinical Impressions(s) / UC Diagnoses   Final diagnoses:  Allergic urticaria    ED Discharge Orders        Ordered    predniSONE (STERAPRED UNI-PAK 21 TAB) 10 MG (21) TBPK tablet  Daily     01/13/18 1307       Controlled Substance Prescriptions [place] Controlled Substance Registry consulted? Not Applicable   Lew DawesWieters, Hallie C, [place] JerseyPA-C 01/13/18 1318

## 2018-01-13 NOTE — ED Triage Notes (Signed)
Pt c/o hives up and down her arms and legs x11 days.

## 2018-01-14 ENCOUNTER — Telehealth: Payer: Self-pay | Admitting: *Deleted

## 2018-01-14 ENCOUNTER — Other Ambulatory Visit: Payer: Self-pay | Admitting: Primary Care

## 2018-01-14 ENCOUNTER — Encounter: Payer: Self-pay | Admitting: Primary Care

## 2018-01-14 DIAGNOSIS — Z9109 Other allergy status, other than to drugs and biological substances: Secondary | ICD-10-CM

## 2018-01-14 DIAGNOSIS — Z91018 Allergy to other foods: Secondary | ICD-10-CM

## 2018-01-14 MED ORDER — EPINEPHRINE 0.3 MG/0.3ML IJ SOAJ: 0.3000 mg | Freq: Once | INTRAMUSCULAR | 0 refills | Status: AC

## 2018-01-14 NOTE — Telephone Encounter (Signed)
[name] TELEPHONE ADVICE RECORD [facility] Patient Name: Taylor Mullins Gender: Female DOB: 08/06/1961 Age: [age] Y 9 M 17 D Return Phone Number: [phone] (Primary) Address: [place]Zip: [place] [id number] Client [name] Client Site [facility] Physician Vernona Riegerlark, Katherine - NP Contact Type Call Who Is Calling Patient / Member / Family / Caregiver Call Type Triage / Clinical Relationship To Patient Self Return Phone Number [phone] (Primary) Chief Complaint Hives Reason for Call Symptomatic / Request for Health Information Initial Comment Pt has hives for over a week; swelling in mouth no tongue; not having trouble breathing Translation No Nurse Assessment Nurse: Doylene Canardonner, RN, Lesa Date/Time (Eastern Time): 01/13/2018 9:58:41 AM Confirm and document reason for call. If symptomatic, describe symptoms. ---Caller states she has hives Does the patient have any new or worsening symptoms? ---Yes Will a triage be completed? ---Yes Related visit to physician within the last 2 weeks? ---No Does the PT have any chronic conditions? (i.e. diabetes, asthma, etc.) ---Yes List chronic conditions. ---HTN, Is this a behavioral health or substance abuse call? ---No Guidelines Guideline Title Affirmed Question Affirmed Notes Nurse Date/Time (Eastern Time) Rash - Widespread On Drugs Face or lip swelling Conner, RN, Lesa 01/13/2018 10:01:15 AM Disp. Time Lamount Cohen(Eastern Time) Disposition Final User 01/13/2018 10:05:44 AM See Physician within 4 Hours (or PCP triage) Yes Conner, RN, Gean MaidensLesa Caller Disagree/Comply Comply Caller Understands Yes PreDisposition Go to Urgent Care/Walk-In Clinic PLEASE NOTE: All timestamps contained within this report are represented as [place] Standard Time. CONFIDENTIALTY NOTICE: This fax transmission is intended only for the addressee. It  contains information that is legally privileged, confidential or otherwise protected from use or disclosure. If you are not the intended recipient, you are strictly prohibited from reviewing, disclosing, copying using or disseminating any of this information or taking any action in reliance on or regarding this information. If you have received this fax in error, please notify us immediately by telephone so that we can arrange for its return to us. Phone: [phone], Toll-Free: [phone], Fax: [phone] Page: 2 of 2 Call Id: [id number] Care Advice Given Per Guideline SEE PHYSICIAN WITHIN 4 HOURS (or PCP triage): * IF OFFICE WILL BE CLOSED AND NO PCP TRIAGE: You need to be seen within the next 3 or 4 hours. A nearby Urgent Care Center is often a good source of care. Another choice is to go to the ER. Go sooner if you become worse. CALL BACK IF: * You become worse. CARE ADVICE given per Rash - Widespread on Drugs (Adult) guideline [facility]

## 2018-01-14 NOTE — Telephone Encounter (Signed)
Looks like patient was seen at urgent care and that symptoms were considered secondary to atorvastatin.  Will send patient My Chart message.

## 2018-01-31 ENCOUNTER — Encounter: Payer: Self-pay | Admitting: Primary Care

## 2018-02-11 ENCOUNTER — Other Ambulatory Visit: Payer: BC Managed Care – PPO

## 2018-03-02 ENCOUNTER — Encounter (HOSPITAL_COMMUNITY): Payer: Self-pay | Admitting: *Deleted

## 2018-03-02 ENCOUNTER — Other Ambulatory Visit: Payer: Self-pay

## 2018-03-02 ENCOUNTER — Ambulatory Visit (HOSPITAL_COMMUNITY)
Admission: EM | Admit: 2018-03-02 | Discharge: 2018-03-02 | Disposition: A | Discharge status: Home / Self Care | Payer: BC Managed Care – PPO | Attending: Family Medicine | Admitting: Family Medicine

## 2018-03-02 DIAGNOSIS — L500 Allergic urticaria: Secondary | ICD-10-CM

## 2018-03-02 HISTORY — DX: Urticaria, unspecified: L50.9

## 2018-03-02 MED ORDER — METHYLPREDNISOLONE ACETATE 80 MG/ML IJ SUSP
INTRAMUSCULAR | Status: AC
  Filled 2018-03-02: qty 1

## 2018-03-02 MED ORDER — METHYLPREDNISOLONE SODIUM SUCC 125 MG IJ SOLR
80.0000 mg | Freq: Once | INTRAMUSCULAR | Status: AC
Start: 2018-03-02 — End: 2018-03-02
  Administered 2018-03-02: 80 mg via INTRAMUSCULAR

## 2018-03-02 MED ORDER — PREDNISONE 10 MG (48) PO TBPK: ORAL_TABLET | ORAL | 0 refills | Status: DC

## 2018-03-02 MED ORDER — METHYLPREDNISOLONE SODIUM SUCC 125 MG IJ SOLR
INTRAMUSCULAR | Status: AC
  Filled 2018-03-02: qty 2

## 2018-03-02 NOTE — ED Triage Notes (Signed)
Reports uritcaria to entire body while out of town this week.  Has had multiple bouts of urticaria; had allergy testing done last week; will soon be starting shots.  Denies any tongue or throat swelling, but "inside of my cheeks feel a little puffy".

## 2018-03-02 NOTE — ED Provider Notes (Signed)
[facility]   [id number] 03/02/18 Arrival Time: 1158  ASSESSMENT & PLAN:  1. Allergic urticaria     Meds ordered this encounter  Medications  . methylPREDNISolone sodium succinate (SOLU-MEDROL) 125 mg/2 mL injection 80 mg  . predniSONE (STERAPRED UNI-PAK 48 TAB) 10 MG (48) TBPK tablet    Sig: Take as directed.    Dispense:  48 tablet    Refill:  0    Will f/u with her PCP/Allergist or here if not seeing improvement over the next 1-2 days. Bendaryl/Zyrtec as needed.  Reviewed expectations re: course of current medical issues. Questions answered. Outlined signs and symptoms indicating need for more acute intervention. Patient verbalized understanding. After Visit Summary given.   SUBJECTIVE:  [name] is a 57 y.o. female who presents with a skin complaint.   Location: "all over" Onset: gradual Duration: several days Pruritic? Yes Painful? No Progression: fluctuating a lot  Drainage? No  Other associated symptoms: fatigue Therapies tried thus far: Benadryl and Zyrtec without much change Denies fever. No specific aggravating or alleviating factors reported. History of frequent urticaria. Just had allergy testing done a couple of weeks ago. Has not started allergy shots yet.  ROS: As per HPI.  OBJECTIVE: Vitals:   03/02/18 1207  BP: (!) 136/97  Pulse: (!) 108  Resp: 16  Temp: 99.4 F (37.4 C)  TempSrc: Oral  SpO2: 96%    General appearance: alert; no distress Lungs: clear to auscultation bilaterally Heart: regular rate and rhythm Extremities: no edema Skin: warm and dry; smooth, slightly elevated and erythematous plaques of variable size over her body from head down Psychological: alert and cooperative; normal mood and affect  Allergies  Allergen Reactions  . 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 Essential hypertension   . Ganglion cyst    right ankle  . Migraine    occasionally   . TMJ dysfunction   . Urinary problem   . Urticaria    Social History   Socioeconomic History  . Marital status: Married    Spouse name: Not on file  . Number of children: Not on file  . Years of education: Not on file  . Highest education level: Not on file  Occupational History  . Not on file  Social Needs  . Financial resource strain: Not on file  . Food insecurity:    Worry: Not on file    Inability: Not on file  . Transportation needs:    Medical: Not on file    Non-medical: Not on file  Tobacco Use  . Smoking status: Never Smoker  . Smokeless tobacco: Never Used  Substance and Sexual Activity  . Alcohol use: Yes    Comment: rarely  . Drug use: No  . Sexual activity: Not on file  Lifestyle  . Physical activity:    Days per week: Not on file    Minutes per session: Not on file  . Stress: Not on file  Relationships  . Social connections:    Talks on phone: Not on file    Gets together: Not on file    Attends religious service: Not on file    Active member of club or organization: Not on file    Attends meetings of clubs or organizations: Not on file  Relationship status: Not on file  . Intimate partner violence:    Fear of current or ex partner: Not on file    Emotionally abused: Not on file    Physically abused: Not on file    Forced sexual activity: Not on file  Other Topics Concern  . Not on file  Social History Narrative   Married.   3 children.    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    Family History  Problem Relation Age of Onset  . Colon cancer Mother   . Hypertension Mother   . Hyperlipidemia Mother   . Crohn's disease Father   . Heart disease Father    Past Surgical History:  Procedure Laterality Date  . ABDOMINAL HYSTERECTOMY    . ABLATION    . ANTERIOR AND POSTERIOR REPAIR  N/A 06/19/2017   Procedure: ANTERIOR (CYSTOCELE) AND POSTERIOR REPAIR (RECTOCELE);  Surgeon: Alfredo Martinez, MD;  Location: [facility];  Service: Urology;  Laterality: N/A;  . CESAREAN SECTION  [phone]  . CYSTOSCOPY N/A 06/19/2017   Procedure: CYSTOSCOPY;  Surgeon: Alfredo Martinez, MD;  Location: [facility];  Service: Urology;  Laterality: N/A;  . VAGINAL HYSTERECTOMY N/A 06/19/2017   Procedure: HYSTERECTOMY VAGINAL TOTAL;  Surgeon: Levi Aland, MD;  Location: [facility];  Service: Gynecology;  Laterality: N/AMardella Layman, MD 03/02/18 1226

## 2018-03-02 NOTE — Discharge Instructions (Addendum)
Meds ordered this encounter  Medications   methylPREDNISolone sodium succinate (SOLU-MEDROL) 125 mg/2 mL injection 80 mg   predniSONE (STERAPRED UNI-PAK 48 TAB) 10 MG (48) TBPK tablet    Sig: Take as directed.    Dispense:  48 tablet    Refill:  0

## 2018-05-15 ENCOUNTER — Telehealth: Payer: Self-pay | Admitting: Primary Care

## 2018-05-15 NOTE — Telephone Encounter (Signed)
Copied from CRM [id number]. Topic: Quick Communication - Rx Refill/Question >> May 15, 2018  1:49 PM Alexander BergeronBarksdale, Harvey B wrote: Medication: atorvastatin (LIPITOR) 20 MG tablet [[id number]][[id number]]  DISCONTINUED  Pt called to ask to be put on another cholesterol medication from the one above b/c she has broken out in hives again from it, pt saw her allergist this morning; pt was explained that the medication was on her discontinued list and pt understood b/c pt states she stopped taking the medication above b/c she broke out in hives before, but since apparently the medication had an automatic refill and the pharmacy kept calling her to come pick it up so she began the medication again(???), call pt to advise

## 2018-05-16 NOTE — Telephone Encounter (Signed)
Pt states she picked up Atorvastatin that was automatically filled at [facility] on June 1st and started to retake the medication. Pt states she was previously on the medication but stopped taking the medication due to developing hives. Pt states she started to develop hives again with taking the medication and was seen by her allergy doctor today and was advised to take the medication. Medication not on pt's current medication list but the pt states she assumed that because she received a text from the pharmacy  to let her know a refill was available that she was supposed to start the medication again. Pt states she know that cholesterol was high and wants to know if she can be prescribed another medication and [facility] [place].

## 2018-05-17 ENCOUNTER — Encounter: Payer: Self-pay | Admitting: Primary Care

## 2018-05-17 NOTE — Telephone Encounter (Signed)
Has she taken any other statins previously? If so, which ones? Did not see any under historical meds.

## 2018-05-18 MED ORDER — SIMVASTATIN 10 MG PO TABS: 10.0000 mg | ORAL_TABLET | Freq: Every day | ORAL | 2 refills | Status: DC

## 2018-05-21 NOTE — Telephone Encounter (Signed)
Discard. Patient was contact regarding this through MyChart over the weekend.

## 2018-06-05 ENCOUNTER — Other Ambulatory Visit: Payer: Self-pay | Admitting: Primary Care

## 2018-06-05 DIAGNOSIS — I10 Essential (primary) hypertension: Secondary | ICD-10-CM

## 2018-08-13 ENCOUNTER — Other Ambulatory Visit: Payer: Self-pay | Admitting: Internal Medicine

## 2018-08-13 DIAGNOSIS — E785 Hyperlipidemia, unspecified: Secondary | ICD-10-CM

## 2018-08-15 NOTE — Telephone Encounter (Signed)
Medication was changed 05/2018... Please advise if okay to refill or if pt needs a lab only appt

## 2018-08-15 NOTE — Telephone Encounter (Signed)
Needs repeat lipid panel. Will send refill and contact patient via my chart.

## 2018-08-16 ENCOUNTER — Other Ambulatory Visit (INDEPENDENT_AMBULATORY_CARE_PROVIDER_SITE_OTHER): Payer: BC Managed Care – PPO

## 2018-08-16 DIAGNOSIS — E785 Hyperlipidemia, unspecified: Secondary | ICD-10-CM

## 2018-08-16 LAB — HEPATIC FUNCTION PANEL
ALT: 20 U/L (ref 0–35)
AST: 20 U/L (ref 0–37)
Albumin: 4.5 g/dL (ref 3.5–5.2)
Alkaline Phosphatase: 77 U/L (ref 39–117)
BILIRUBIN DIRECT: 0.2 mg/dL (ref 0.0–0.3)
BILIRUBIN TOTAL: 1.2 mg/dL (ref 0.2–1.2)
Total Protein: 7.5 g/dL (ref 6.0–8.3)

## 2018-08-16 LAB — LIPID PANEL
CHOL/HDL RATIO: 3
CHOLESTEROL: 180 mg/dL (ref 0–200)
HDL: 60.5 mg/dL (ref >39.00)
LDL CALC: 85 mg/dL (ref 0–99)
NONHDL: 119.8
Triglycerides: 173 mg/dL — ABNORMAL HIGH (ref 0.0–149.0)
VLDL: 34.6 mg/dL (ref 0.0–40.0)

## 2018-10-12 ENCOUNTER — Encounter (HOSPITAL_COMMUNITY): Payer: Self-pay | Admitting: Emergency Medicine

## 2018-10-12 ENCOUNTER — Ambulatory Visit (HOSPITAL_COMMUNITY)
Admission: EM | Admit: 2018-10-12 | Discharge: 2018-10-12 | Disposition: A | Discharge status: Home / Self Care | Payer: BC Managed Care – PPO | Attending: Family Medicine | Admitting: Family Medicine

## 2018-10-12 DIAGNOSIS — J111 Influenza due to unidentified influenza virus with other respiratory manifestations: Secondary | ICD-10-CM

## 2018-10-12 DIAGNOSIS — R69 Illness, unspecified: Secondary | ICD-10-CM | POA: Diagnosis not present

## 2018-10-12 MED ORDER — OSELTAMIVIR PHOSPHATE 75 MG PO CAPS: 75.0000 mg | ORAL_CAPSULE | Freq: Two times a day (BID) | ORAL | 0 refills | Status: DC

## 2018-10-12 MED ORDER — AZITHROMYCIN 250 MG PO TABS: ORAL_TABLET | ORAL | 0 refills | Status: DC

## 2018-10-12 MED ORDER — ACETAMINOPHEN 325 MG PO TABS
ORAL_TABLET | ORAL | Status: AC
  Filled 2018-10-12: qty 2

## 2018-10-12 MED ORDER — ACETAMINOPHEN 325 MG PO TABS
650.0000 mg | ORAL_TABLET | Freq: Once | ORAL | Status: AC
  Administered 2018-10-12: 650 mg via ORAL

## 2018-10-12 NOTE — ED Provider Notes (Signed)
Patient: Taylor Mullins MRN: [id number] DOB: 07/26/1961 PCP: Doreene Nestlark, Katherine K, NP     Subjective:  Chief Complaint  Patient presents with  . URI  . Fever    HPI: The patient is a 57 y.o. female who presents today for Uri and fever. Her symtpoms started Monday evening after school and consisted of right ear pain and right sided throat pain. As the week progressed she felt like she was getting worse and worse. Her temperature was around 99 on both Tuesday and Wednesday. She has had severe congestion. 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  She has been taking corciden HBP and this helps, but once it wears off symptoms are worse. She feels like her symptoms have progressed severely in last 24 hours. She has had chills/fever, pain in her chest when she coughs/sneezes, cough that is dry and clear rhinorrhea. She has taken tylenol PRN. She did take some at [facility]. She has no asthma, copd and is not a smoker. She denies any shortness of breath or wheezing.     Review of Systems  Constitutional: Positive for chills and fever.  HENT: Positive for congestion, ear pain, rhinorrhea, sinus pressure, sneezing, sore throat and voice change. Negative for sinus pain.   Eyes: Negative for discharge and itching.  Respiratory: Positive for cough. Negative for chest tightness, shortness of breath and wheezing.   Cardiovascular: Negative for chest pain and palpitations.  Gastrointestinal: Negative for abdominal pain, nausea and vomiting.  Skin: Negative for rash.    Allergies Patient is allergic to shrimp [shellfish allergy].  Past Medical History Patient  has a past medical history of Essential hypertension, Ganglion cyst, Migraine, TMJ dysfunction, Urinary problem, and Urticaria.  Surgical History Patient  has a past surgical history that includes Ablation; Cesarean section (40,98(89,92); Cystoscopy (N/A, 06/19/2017); Anterior and posterior repair (N/A, 06/19/2017); Vaginal hysterectomy (N/A, 06/19/2017); and  Abdominal hysterectomy.  Family History Pateint's family history includes Colon cancer in her mother; Crohn's disease in her father; Heart disease in her father; Hyperlipidemia in her mother; Hypertension in her mother.  Social History Patient  reports that she has never smoked. She has never used smokeless tobacco. She reports that she drinks alcohol. She reports that she does not use drugs.    Objective: Vitals:   10/12/18 1110  BP: (!) 152/92  Pulse: (!) 105  Resp: 18  Temp: (!) 101.1 F (38.4 C)  TempSrc: Oral  SpO2: 98%    There is no height or weight on file to calculate BMI.  Physical Exam  Constitutional: She appears well-developed and well-nourished.  HENT:  Right Ear: External ear normal.  Left Ear: External ear normal.  Mouth/Throat: Oropharynx is clear and moist.  Tm pearly with light reflex bilaterally  No ttp over sinuses   Neck: Normal range of motion. Neck supple.  Cardiovascular: Normal rate, regular rhythm and normal heart sounds.  Pulmonary/Chest: Effort normal and breath sounds normal. She has no wheezes. She has no rales.  Abdominal: Soft. Bowel sounds are normal.  Lymphadenopathy:    She has no cervical adenopathy.  Skin: Capillary refill takes less than 2 seconds. No rash noted.  Vitals reviewed.      Assessment/plan: Influenza-like illness  -course of tamiflu. Push fluids, rest and discussed conservative therapy with rest, fluids, cool mist humidifier. Recommended honey, robitussin DM and  nyquil for cough. Hand washing precautions given. I did send in zpack for her to start if not better in 72 hours or if fever persists.  Instructed not to take cholesterol medication if begins this. F/u with PCP if no improvement and precautions given.    Orland Mustard, MD  10/12/2018    Orland Mustard, MD 10/12/18 [phone]

## 2018-10-12 NOTE — ED Triage Notes (Signed)
Pt here for URI sx and fever x 5 days

## 2018-10-12 NOTE — Discharge Instructions (Addendum)
Symptoms are consistent with the flu.  Make sure you drink lots of water, rest and take medication as prescribed.  Other things that I like to include a coolmist humidifier at night and a tablespoon of honey daily for cough.  Good over-the-counter cough medications include Robitussin-DM but you can continue your cough medicine that you have.  Follow-up with PCP if feel like symptoms are not improving or you are getting worse.  Start zpack if not feeling better in 48 hours or worsening symptoms. Hold cholesterol drug if start this.

## 2018-11-28 ENCOUNTER — Other Ambulatory Visit: Payer: Self-pay | Admitting: Primary Care

## 2018-11-28 DIAGNOSIS — I10 Essential (primary) hypertension: Secondary | ICD-10-CM

## 2018-11-30 ENCOUNTER — Other Ambulatory Visit: Payer: Self-pay | Admitting: Primary Care

## 2018-11-30 DIAGNOSIS — E785 Hyperlipidemia, unspecified: Secondary | ICD-10-CM

## 2018-12-02 ENCOUNTER — Other Ambulatory Visit: Payer: Self-pay | Admitting: Primary Care

## 2018-12-02 DIAGNOSIS — I10 Essential (primary) hypertension: Secondary | ICD-10-CM

## 2018-12-20 ENCOUNTER — Other Ambulatory Visit: Payer: Self-pay | Admitting: Primary Care

## 2018-12-20 DIAGNOSIS — E785 Hyperlipidemia, unspecified: Secondary | ICD-10-CM

## 2018-12-20 DIAGNOSIS — I10 Essential (primary) hypertension: Secondary | ICD-10-CM

## 2018-12-25 ENCOUNTER — Other Ambulatory Visit (INDEPENDENT_AMBULATORY_CARE_PROVIDER_SITE_OTHER): Payer: BC Managed Care – PPO

## 2018-12-25 ENCOUNTER — Other Ambulatory Visit: Payer: BC Managed Care – PPO

## 2018-12-25 DIAGNOSIS — I10 Essential (primary) hypertension: Secondary | ICD-10-CM | POA: Diagnosis not present

## 2018-12-25 DIAGNOSIS — E785 Hyperlipidemia, unspecified: Secondary | ICD-10-CM | POA: Diagnosis not present

## 2018-12-25 LAB — BASIC METABOLIC PANEL
BUN: 15 mg/dL (ref 6–23)
CALCIUM: 9.6 mg/dL (ref 8.4–10.5)
CO2: 24 meq/L (ref 19–32)
CREATININE: 0.82 mg/dL (ref 0.40–1.20)
Chloride: 103 mEq/L (ref 96–112)
GFR: 71.92 mL/min (ref >60.00)
Glucose, Bld: 108 mg/dL — ABNORMAL HIGH (ref 70–99)
Potassium: 3.7 mEq/L (ref 3.5–5.1)
Sodium: 141 mEq/L (ref 135–145)

## 2018-12-25 LAB — LIPID PANEL
Cholesterol: 179 mg/dL (ref 0–200)
HDL: 53.2 mg/dL (ref >39.00)
NonHDL: 126.19
Total CHOL/HDL Ratio: 3
Triglycerides: 241 mg/dL — ABNORMAL HIGH (ref 0.0–149.0)
VLDL: 48.2 mg/dL — AB (ref 0.0–40.0)

## 2018-12-25 LAB — LDL CHOLESTEROL, DIRECT: Direct LDL: 103 mg/dL

## 2018-12-25 LAB — HEMOGLOBIN A1C: Hgb A1c MFr Bld: 5.4 % (ref 4.6–6.5)

## 2018-12-27 ENCOUNTER — Encounter: Payer: Self-pay | Admitting: Primary Care

## 2018-12-27 ENCOUNTER — Ambulatory Visit (INDEPENDENT_AMBULATORY_CARE_PROVIDER_SITE_OTHER)
Admission: RE | Admit: 2018-12-27 | Discharge: 2018-12-27 | Disposition: A | Discharge status: Home / Self Care | Payer: BC Managed Care – PPO | Source: Ambulatory Visit | Attending: Primary Care | Admitting: Primary Care

## 2018-12-27 ENCOUNTER — Ambulatory Visit (INDEPENDENT_AMBULATORY_CARE_PROVIDER_SITE_OTHER): Payer: BC Managed Care – PPO | Admitting: Primary Care

## 2018-12-27 VITALS — BP 134/84 | HR 80 | Temp 97.9°F | Ht 68.0 in | Wt 223.5 lb

## 2018-12-27 DIAGNOSIS — M79672 Pain in left foot: Secondary | ICD-10-CM | POA: Diagnosis not present

## 2018-12-27 DIAGNOSIS — E785 Hyperlipidemia, unspecified: Secondary | ICD-10-CM

## 2018-12-27 DIAGNOSIS — Z0000 Encounter for general adult medical examination without abnormal findings: Secondary | ICD-10-CM | POA: Diagnosis not present

## 2018-12-27 DIAGNOSIS — I10 Essential (primary) hypertension: Secondary | ICD-10-CM

## 2018-12-27 MED ORDER — SIMVASTATIN 10 MG PO TABS: 20.0000 mg | ORAL_TABLET | Freq: Every evening | ORAL | 0 refills | Status: DC

## 2018-12-27 NOTE — Patient Instructions (Signed)
Complete xray(s) prior to leaving today. I will notify you of your results once received.  It's important to improve your diet by reducing consumption of fast food, fried food, processed snack foods, sugary drinks. Increase consumption of fresh vegetables and fruits, whole grains, water.  Ensure you are drinking 64 ounces of water daily.  Start exercising. You should be getting 150 minutes of moderate intensity exercise weekly.  We've increased your dose of Simvastatin to 20 mg. You may take two of the 10 mg tablets and update me in 2 weeks.  We will see you in one year for your annual exam or sooner if needed.  It was a pleasure to see you today!   Preventive Care 40-64 Years, Female Preventive care refers to lifestyle choices and visits with your health care provider that can promote health and wellness. What does preventive care include?   A yearly physical exam. This is also called an annual well check.  Dental exams once or twice a year.  Routine eye exams. Ask your health care provider how often you should have your eyes checked.  Personal lifestyle choices, including: ? Daily care of your teeth and gums. ? Regular physical activity. ? Eating a healthy diet. ? Avoiding tobacco and drug use. ? Limiting alcohol use. ? Practicing safe sex. ? Taking low-dose aspirin daily starting at age 2. ? Taking vitamin and mineral supplements as recommended by your health care provider. What happens during an annual well check? The services and screenings done by your health care provider during your annual well check will depend on your age, overall health, lifestyle risk factors, and family history of disease. Counseling Your health care provider may ask you questions about your:  Alcohol use.  Tobacco use.  Drug use.  Emotional well-being.  Home and relationship well-being.  Sexual activity.  Eating habits.  Work and work Statistician.  Method of birth  control.  Menstrual cycle.  Pregnancy history. Screening You may have the following tests or measurements:  Height, weight, and BMI.  Blood pressure.  Lipid and cholesterol levels. These may be checked every 5 years, or more frequently if you are over 12 years old.  Skin check.  Lung cancer screening. You may have this screening every year starting at age 57 if you have a 30-pack-year history of smoking and currently smoke or have quit within the past 15 years.  Colorectal cancer screening. All adults should have this screening starting at age 62 and continuing until age 56. Your health care provider may recommend screening at age 9. You will have tests every 1-10 years, depending on your results and the type of screening test. People at increased risk should start screening at an earlier age. Screening tests may include: ? Guaiac-based fecal occult blood testing. ? Fecal immunochemical test (FIT). ? Stool DNA test. ? Virtual colonoscopy. ? Sigmoidoscopy. During this test, a flexible tube with a tiny camera (sigmoidoscope) is used to examine your rectum and lower colon. The sigmoidoscope is inserted through your anus into your rectum and lower colon. ? Colonoscopy. During this test, a long, thin, flexible tube with a tiny camera (colonoscope) is used to examine your entire colon and rectum.  Hepatitis C blood test.  Hepatitis B blood test.  Sexually transmitted disease (STD) testing.  Diabetes screening. This is done by checking your blood sugar (glucose) after you have not eaten for a while (fasting). You may have this done every 1-3 years.  Mammogram. This may be done  every 1-2 years. Talk to your health care provider about when you should start having regular mammograms. This may depend on whether you have a family history of breast cancer.  BRCA-related cancer screening. This may be done if you have a family history of breast, ovarian, tubal, or peritoneal cancers.  Pelvic  exam and Pap test. This may be done every 3 years starting at age 10. Starting at age 33, this may be done every 5 years if you have a Pap test in combination with an HPV test.  Bone density scan. This is done to screen for osteoporosis. You may have this scan if you are at high risk for osteoporosis. Discuss your test results, treatment options, and if necessary, the need for more tests with your health care provider. Vaccines Your health care provider may recommend certain vaccines, such as:  Influenza vaccine. This is recommended every year.  Tetanus, diphtheria, and acellular pertussis (Tdap, Td) vaccine. You may need a Td booster every 10 years.  Varicella vaccine. You may need this if you have not been vaccinated.  Zoster vaccine. You may need this after age 22.  Measles, mumps, and rubella (MMR) vaccine. You may need at least one dose of MMR if you were born in 1957 or later. You may also need a second dose.  Pneumococcal 13-valent conjugate (PCV13) vaccine. You may need this if you have certain conditions and were not previously vaccinated.  Pneumococcal polysaccharide (PPSV23) vaccine. You may need one or two doses if you smoke cigarettes or if you have certain conditions.  Meningococcal vaccine. You may need this if you have certain conditions.  Hepatitis A vaccine. You may need this if you have certain conditions or if you travel or work in places where you may be exposed to hepatitis A.  Hepatitis B vaccine. You may need this if you have certain conditions or if you travel or work in places where you may be exposed to hepatitis B.  Haemophilus influenzae type b (Hib) vaccine. You may need this if you have certain conditions. Talk to your health care provider about which screenings and vaccines you need and how often you need them. This information is not intended to replace advice given to you by your health care provider. Make sure you discuss any questions you have with  your health care provider. Document Released: 12/03/2015 Document Revised: 12/27/2017 Document Reviewed: 09/07/2015 Elsevier Interactive Patient Education  2019 [employer].

## 2018-12-27 NOTE — Progress Notes (Signed)
Subjective:    Patient ID: Taylor Mullins, female    DOB: 02/15/1961, 58 y.o.   MRN: [id number]  HPI  Taylor Mullins is a 58 year old female who presents today for complete physical and a chief complaint of left foot pain.  Immunizations: -Tetanus: Completed in 2015 -Influenza: Completed this season    Diet: She endorses a fair diet Breakfast: Skips, fast food  Lunch: Sandwich, applesauce, left overs Dinner: Meat, vegetable, starch, cereal, snacks, restaurants  Snacks: Chocolate, fruit, chips, nuts Desserts: Daily  Beverages: Sweet tea, water, chocolate milk, occasional juice   Exercise: She is not exercising regularly.  Eye exam: Completed in 2019 Dental exam: Completes semi-annually  Colonoscopy: Completed in 2019, due again in 2024 Pap Smear: Hysterectomy  Mammogram: Completed in Spring 2019 Hep C Screen: Negative in 2018  BP Readings from Last 3 Encounters:  12/27/18 134/84  10/12/18 (!) 152/92  03/02/18 (!) 136/97   The 10-year ASCVD risk score Denman George(Goff DC Jr., et al., 2013) is: 3%   Values used to calculate the score:     Age: [age] years     Sex: Female     Is Non-Hispanic African American: No     Diabetic: No     Tobacco smoker: No     Systolic Blood Pressure: 134 mmHg     Is BP treated: Yes     HDL Cholesterol: 53.2 mg/dL     Total Cholesterol: 179 mg/dL    Review of Systems  Constitutional: Negative for unexpected weight change.  HENT: Negative for rhinorrhea.   Respiratory: Negative for cough and shortness of breath.   Cardiovascular: Negative for chest pain.  Gastrointestinal: Negative for constipation and diarrhea.  Genitourinary: Negative for difficulty urinating.  Musculoskeletal: Positive for arthralgias. Negative for myalgias.       Left foot pain, heel, 5th digit x 3 weeks. No injury/trauma. Worse in the morning, feels pain throughout the day. Wearing comfortable shoes. She denies numbness/tinlging, swelling, color change.  Has tried increasing  support with insoles to shoes without provement.  Skin: Negative for rash.  Allergic/Immunologic: Negative for environmental allergies.  Neurological: Negative for dizziness, numbness and headaches.  Psychiatric/Behavioral:       Stress daily at work       Past Medical History:  Diagnosis Date  . Essential hypertension   . Ganglion cyst    right ankle  . Migraine    occasionally   . TMJ dysfunction   . Urinary problem   . Urticaria      Social History   Socioeconomic History  . Marital status: Married    Spouse name: Not on file  . Number of children: Not on file  . Years of education: Not on file  . Highest education level: Not on file  Occupational History  . Not on file  Social Needs  . Financial resource strain: Not on file  . Food insecurity:    Worry: Not on file    Inability: Not on file  . Transportation needs:    Medical: Not on file    Non-medical: Not on file  Tobacco Use  . Smoking status: Never Smoker  . Smokeless tobacco: Never Used  Substance and Sexual Activity  . Alcohol use: Yes    Comment: rarely  . Drug use: No  . Sexual activity: Not on file  Lifestyle  . Physical activity:    Days per week: Not on file    Minutes per session: Not on  file  . Stress: Not on file  Relationships  . Social connections:    Talks on phone: Not on file    Gets together: Not on file    Attends religious service: Not on file    Active member of club or organization: Not on file    Attends meetings of clubs or organizations: Not on file    Relationship status: Not on file  . Intimate partner violence:    Fear of current or ex partner: Not on file    Emotionally abused: Not on file    Physically abused: Not on file    Forced sexual activity: Not on file  Other Topics Concern  . Not on file  Social History Narrative   Married.   3 children.    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     Past Surgical  History:  Procedure Laterality Date  . ABDOMINAL HYSTERECTOMY    . ABLATION    . ANTERIOR AND POSTERIOR REPAIR N/A 06/19/2017   Procedure: ANTERIOR (CYSTOCELE) AND POSTERIOR REPAIR (RECTOCELE);  Surgeon: Alfredo Martinez, MD;  Location: [facility];  Service: Urology;  Laterality: N/A;  . CESAREAN SECTION  [phone]  . CYSTOSCOPY N/A 06/19/2017   Procedure: CYSTOSCOPY;  Surgeon: Alfredo Martinez, MD;  Location: [facility];  Service: Urology;  Laterality: N/A;  . VAGINAL HYSTERECTOMY N/A 06/19/2017   Procedure: HYSTERECTOMY VAGINAL TOTAL;  Surgeon: Levi Aland, MD;  Location: [facility];  Service: Gynecology;  Laterality: N/A;    Family History  Problem Relation Age of Onset  . Colon cancer Mother   . Hypertension Mother   . Hyperlipidemia Mother   . Crohn's disease Father   . Heart disease Father     Allergies  Allergen Reactions  . 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 cetirizine (ZYRTEC) 10 MG tablet Take 10 mg by mouth every evening.     . hydrochlorothiazide (HYDRODIURIL) 25 MG tablet TAKE 1 TABLET BY MOUTH EVERY MORNING TO CONTROL HIGH BLOOD PRESSURE 90 tablet 3  . metoprolol tartrate (LOPRESSOR) 25 MG tablet TAKE 1 TABLET BY MOUTH TWICE A DAY 180 tablet 0  . simvastatin (ZOCOR) 10 MG tablet TAKE 1 TABLET BY MOUTH EVERY DAY FOR CHOLESTEROL 90 tablet 0   No current facility-administered medications on file prior to visit.     BP 134/84   Pulse 80   Temp 97.9 F (36.6 C) (Oral)   Ht 5\' 8"  (1.727 m)   Wt 223 lb 8 oz (101.4 kg)   BMI 33.98 kg/m    Objective:   Physical Exam  Constitutional: She is oriented to person, place, and time. She appears well-nourished.  HENT:  Mouth/Throat: No oropharyngeal exudate.  Eyes: Pupils are equal, round, and reactive to light. EOM are normal.    Neck: Neck supple. No thyromegaly present.  Cardiovascular: Normal rate and regular rhythm.  Respiratory: Effort normal and breath sounds normal.  GI: Soft. Bowel sounds are normal. There is no abdominal tenderness.  Musculoskeletal: Normal range of motion.     Left foot: Normal range of motion. No tenderness or bony tenderness.       Feet:     Comments: Pain to left heel with dorsiflexion of  left foot.  Neurological: She is alert and oriented to person, place, and time.  Skin: Skin is warm and dry.  Psychiatric: She has a normal mood and affect.           Assessment & Plan:

## 2018-12-27 NOTE — Assessment & Plan Note (Signed)
Borderline in the office today.  Patient endorses that she was rushing to get here for her appointment today. We will have her keep an eye on blood pressure and work on lifestyle changes including weight loss through healthy diet and exercise. Continue metoprolol and hydrochlorothiazide as prescribed. BMP reviewed.

## 2018-12-27 NOTE — Assessment & Plan Note (Signed)
Acute x3 weeks, history of fracture to left metatarsal. Exam today overall unremarkable, see physical exam. Differentials include heel spur versus osteoarthritis.  Check plain films of the foot today. Consider podiatry evaluation if warranted.

## 2018-12-27 NOTE — Assessment & Plan Note (Signed)
Immunizations up-to-date. Pap smear mammogram up-to-date. Colonoscopy up-to-date. Recommended regular exercise and healthy diet. Exam today overall unremarkable.  See notes regarding acute foot pain. Labs reviewed. Follow-up in 1 year for CPE.

## 2018-12-30 DIAGNOSIS — M7732 Calcaneal spur, left foot: Secondary | ICD-10-CM

## 2019-01-03 NOTE — Telephone Encounter (Signed)
Anastasiya, can you help?

## 2019-01-13 ENCOUNTER — Ambulatory Visit (INDEPENDENT_AMBULATORY_CARE_PROVIDER_SITE_OTHER): Payer: BC Managed Care – PPO

## 2019-01-13 ENCOUNTER — Ambulatory Visit: Payer: BC Managed Care – PPO | Admitting: Podiatry

## 2019-01-13 ENCOUNTER — Other Ambulatory Visit: Payer: Self-pay | Admitting: Podiatry

## 2019-01-13 ENCOUNTER — Encounter: Payer: Self-pay | Admitting: Podiatry

## 2019-01-13 VITALS — BP 126/87 | HR 70 | Resp 16

## 2019-01-13 DIAGNOSIS — M722 Plantar fascial fibromatosis: Secondary | ICD-10-CM | POA: Diagnosis not present

## 2019-01-13 DIAGNOSIS — M79672 Pain in left foot: Secondary | ICD-10-CM | POA: Diagnosis not present

## 2019-01-13 DIAGNOSIS — S92355A Nondisplaced fracture of fifth metatarsal bone, left foot, initial encounter for closed fracture: Secondary | ICD-10-CM

## 2019-01-13 MED ORDER — TRIAMCINOLONE ACETONIDE 10 MG/ML IJ SUSP
10.0000 mg | Freq: Once | INTRAMUSCULAR | Status: AC
  Administered 2019-01-13: 10 mg

## 2019-01-13 MED ORDER — DICLOFENAC SODIUM 75 MG PO TBEC: 75.0000 mg | DELAYED_RELEASE_TABLET | Freq: Two times a day (BID) | ORAL | 2 refills | Status: DC

## 2019-01-13 NOTE — Patient Instructions (Signed)

## 2019-01-13 NOTE — Progress Notes (Signed)
   Subjective:    Patient ID: Taylor Mullins, female    DOB: [birth year], 58 y.o.   MRN: [id number]  HPI    Review of Systems  All other systems reviewed and are negative.      Objective:   Physical Exam        Assessment & Plan:

## 2019-01-14 NOTE — Progress Notes (Signed)
Subjective:   Patient ID: Taylor Mullins, female   DOB: 58 y.o.   MRN: [id number]   HPI Patient presents stating that she has a lot of pain on the bottom of her left heel of around 6 weeks duration and she did have a fracture of the outside of her left foot in May.  Patient states it hurts all day and is worse after periods of sitting or activity.  Patient does not smoke and likes to be active   Review of Systems  All other systems reviewed and are negative.       Objective:  Physical Exam Vitals signs and nursing note reviewed.  Constitutional:      Appearance: She is well-developed.  Pulmonary:     Effort: Pulmonary effort is normal.  Musculoskeletal: Normal range of motion.  Skin:    General: Skin is warm.  Neurological:     Mental Status: She is alert.     Neurovascular status found to be intact muscle strength is adequate range of motion within normal limits with patient noted to have exquisite discomfort of the plantar left heel at the insertional point tendon calcaneus with mild swelling still on the outside of the left foot with minimal discomfort.  Patient is noted to have good digital perfusion and is well oriented x3     Assessment:  Acute plantar fasciitis left heel with inflammation with history of fracture left fifth metatarsal with mild residual swelling     Plan:  H&P reviewed condition x-rays and at this point will get a focus on the heel and I did sterile prep and injected the fascia 3 mg Kenalog 5 mg Xylocaine at its insertion calcaneus.  Applied fascial brace to lift the arch gave instructions for supportive shoes elevated heels and reappoint to recheck  X-ray indicates there is spur formation plantar heel left and history of fracture of the base of the fifth metatarsal left which appears to have healed

## 2019-01-24 DIAGNOSIS — E785 Hyperlipidemia, unspecified: Secondary | ICD-10-CM

## 2019-01-27 ENCOUNTER — Encounter: Payer: Self-pay | Admitting: Podiatry

## 2019-01-27 ENCOUNTER — Ambulatory Visit: Payer: BC Managed Care – PPO | Admitting: Podiatry

## 2019-01-27 DIAGNOSIS — S92355A Nondisplaced fracture of fifth metatarsal bone, left foot, initial encounter for closed fracture: Secondary | ICD-10-CM

## 2019-01-27 DIAGNOSIS — M722 Plantar fascial fibromatosis: Secondary | ICD-10-CM | POA: Diagnosis not present

## 2019-01-27 MED ORDER — SIMVASTATIN 20 MG PO TABS: 20.0000 mg | ORAL_TABLET | Freq: Every day | ORAL | 3 refills | Status: DC

## 2019-01-27 NOTE — Progress Notes (Signed)
8

## 2019-01-29 NOTE — Progress Notes (Signed)
Subjective:   Patient ID: Taylor Mullins, female   DOB: 58 y.o.   MRN: [id number]   HPI Patient presents stating she is doing a lot better with her left foot states the pain has reduced a lot with mild discomfort only upon deep palpation   ROS      Objective:  Physical Exam  Plantar fasciitis left which is improving quite nicely at the current time     Assessment:  Acute plantar fasciitis left improving     Plan:  Today I advised on physical therapy anti-inflammatory shoe gear modification and discussed further treatment if symptoms should persist or get worse

## 2019-02-21 ENCOUNTER — Other Ambulatory Visit: Payer: Self-pay | Admitting: Primary Care

## 2019-02-21 DIAGNOSIS — I10 Essential (primary) hypertension: Secondary | ICD-10-CM

## 2019-02-25 ENCOUNTER — Other Ambulatory Visit: Payer: Self-pay | Admitting: Primary Care

## 2019-02-25 DIAGNOSIS — E785 Hyperlipidemia, unspecified: Secondary | ICD-10-CM

## 2019-03-20 ENCOUNTER — Telehealth: Payer: Self-pay | Admitting: Primary Care

## 2019-03-20 NOTE — Telephone Encounter (Signed)
Patient's daughter is due to give birth in the next week. Patient would like to go and stay with her daughter and the baby after the birth.  Her daughter is concerned about covid 68.  Patient wants to know if there's a test she could have done before she goes to stay with her daughter to show patient doesn't have covid 19.  Patient said she doesn't have any symptoms, but her daughter is being very cautious.

## 2019-03-20 NOTE — Telephone Encounter (Signed)
Please notify patient that I completely understand her concern but there is no available testing in our clinic.  There may be other places locally that offer testing.

## 2019-03-20 NOTE — Telephone Encounter (Signed)
Please advise. I have inform patient that there is no testing we could do prior. However, patient would like your input.

## 2019-03-20 NOTE — Telephone Encounter (Signed)
Spoken and notified patient of Kate Clark's comments. Patient verbalized understanding.  

## 2019-05-05 NOTE — Telephone Encounter (Signed)
Patient called stating that she sent a mychart message over the weekend and has not heard anything back. Explained to patient that the office is closed on the weekend and Allie Bossier NP is with patients now and that she looks at her messages ususally during her lunch.

## 2019-05-06 ENCOUNTER — Telehealth: Payer: Self-pay | Admitting: Primary Care

## 2019-05-06 NOTE — Telephone Encounter (Signed)
Patient called this morning and stated that she has reached out to the health dept in [place] and they will be tested there.

## 2019-05-06 NOTE — Telephone Encounter (Signed)
Noted  

## 2019-05-06 NOTE — Telephone Encounter (Signed)
TC to patient. Patient and husband already set up for testing in [facility].

## 2019-05-08 ENCOUNTER — Encounter: Payer: Self-pay | Admitting: Podiatry

## 2019-05-08 ENCOUNTER — Other Ambulatory Visit: Payer: Self-pay

## 2019-05-08 ENCOUNTER — Ambulatory Visit: Payer: BC Managed Care – PPO | Admitting: Podiatry

## 2019-05-08 DIAGNOSIS — M722 Plantar fascial fibromatosis: Secondary | ICD-10-CM | POA: Diagnosis not present

## 2019-05-15 NOTE — Progress Notes (Signed)
Subjective:   Patient ID: Taylor Mullins, female   DOB: 58 y.o.   MRN: [id number]   HPI Patient presents stating my left heel has been bothering him a lot and I need an injection     ROS      Objective:  Physical Exam  Neurovascular status intact muscle strength adequate with discomfort plantar aspect left heel of an acute nature     Assessment:  Acute plantar fasciitis left     Plan:  H&P condition reviewed and today I went ahead did sterile prep and injected the fascia 3 mg Kenalog 5 mg Xylocaine and applied sterile dressing.  Reappoint to recheck

## 2019-05-22 DIAGNOSIS — Z9109 Other allergy status, other than to drugs and biological substances: Secondary | ICD-10-CM | POA: Insufficient documentation

## 2019-08-11 ENCOUNTER — Other Ambulatory Visit: Payer: Self-pay | Admitting: Primary Care

## 2019-08-11 DIAGNOSIS — I10 Essential (primary) hypertension: Secondary | ICD-10-CM

## 2019-08-29 ENCOUNTER — Other Ambulatory Visit: Payer: Self-pay

## 2019-08-29 DIAGNOSIS — Z20822 Contact with and (suspected) exposure to covid-19: Secondary | ICD-10-CM

## 2019-08-30 LAB — NOVEL CORONAVIRUS, NAA: SARS-CoV-2, NAA: NOT DETECTED

## 2019-09-25 ENCOUNTER — Encounter: Payer: Self-pay | Admitting: Podiatry

## 2019-09-25 ENCOUNTER — Ambulatory Visit: Payer: BC Managed Care – PPO | Admitting: Podiatry

## 2019-09-25 ENCOUNTER — Other Ambulatory Visit: Payer: Self-pay

## 2019-09-25 DIAGNOSIS — M722 Plantar fascial fibromatosis: Secondary | ICD-10-CM | POA: Diagnosis not present

## 2019-10-01 NOTE — Progress Notes (Signed)
Subjective:   Patient ID: Taylor Mullins, female   DOB: 58 y.o.   MRN: [id number]   HPI Patient presents stating she has been developing a lot of discomfort in her heel again and it seems to be more in the center outside and worse when she gets up in the morning and after periods of sitting   ROS      Objective:  Physical Exam  Neurovascular status intact with inflammation pain at the center and central lateral of the plantar fascial left at the insertion of the tendon into the calcaneus with moderate depression of the arch noted     Assessment:  Fasciitis-like situation with center and lateral tendon involvement and inadequate stretching occurring at this time     Plan:  H&P reviewed condition did sterile prep from the lateral side and injected the fascia 3 mg Kenalog 5 mg Xylocaine and dispensed night splint with all instructions on usage in order to stretch out the tendon.  Patient will utilize this on an ongoing basis and will be seen back if symptoms were to recur or pathology to occur

## 2019-10-29 IMAGING — DX DG FOOT COMPLETE 3+V*L*
3 series · 3 of 3 positions shown · non-contrast
Comparison: None

CLINICAL DATA: Acute foot pain

EXAM:
LEFT FOOT - COMPLETE 3+ VIEW

[foot ap]
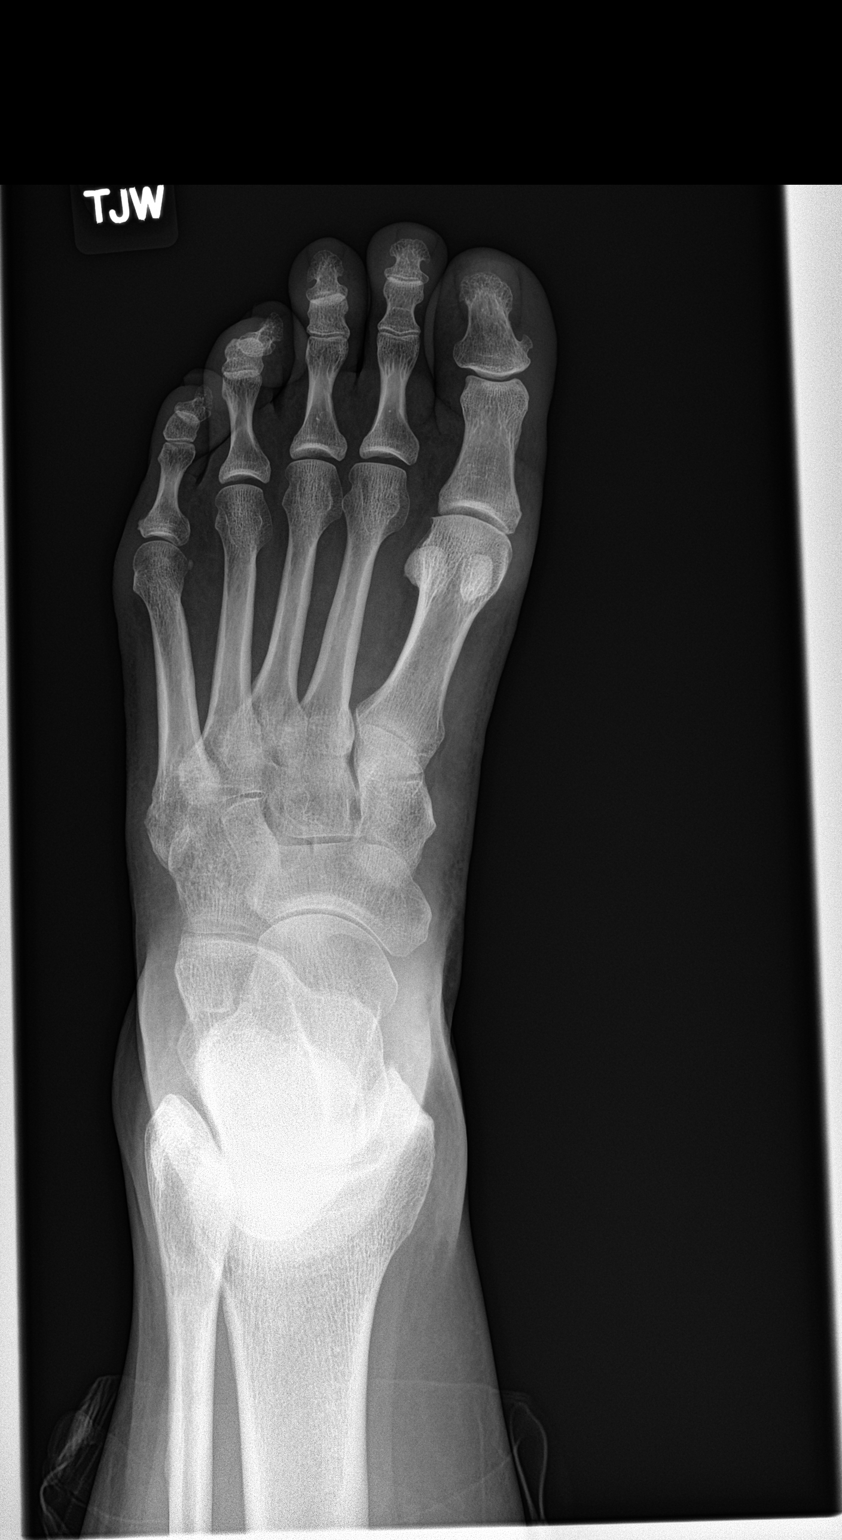

[foot obl]
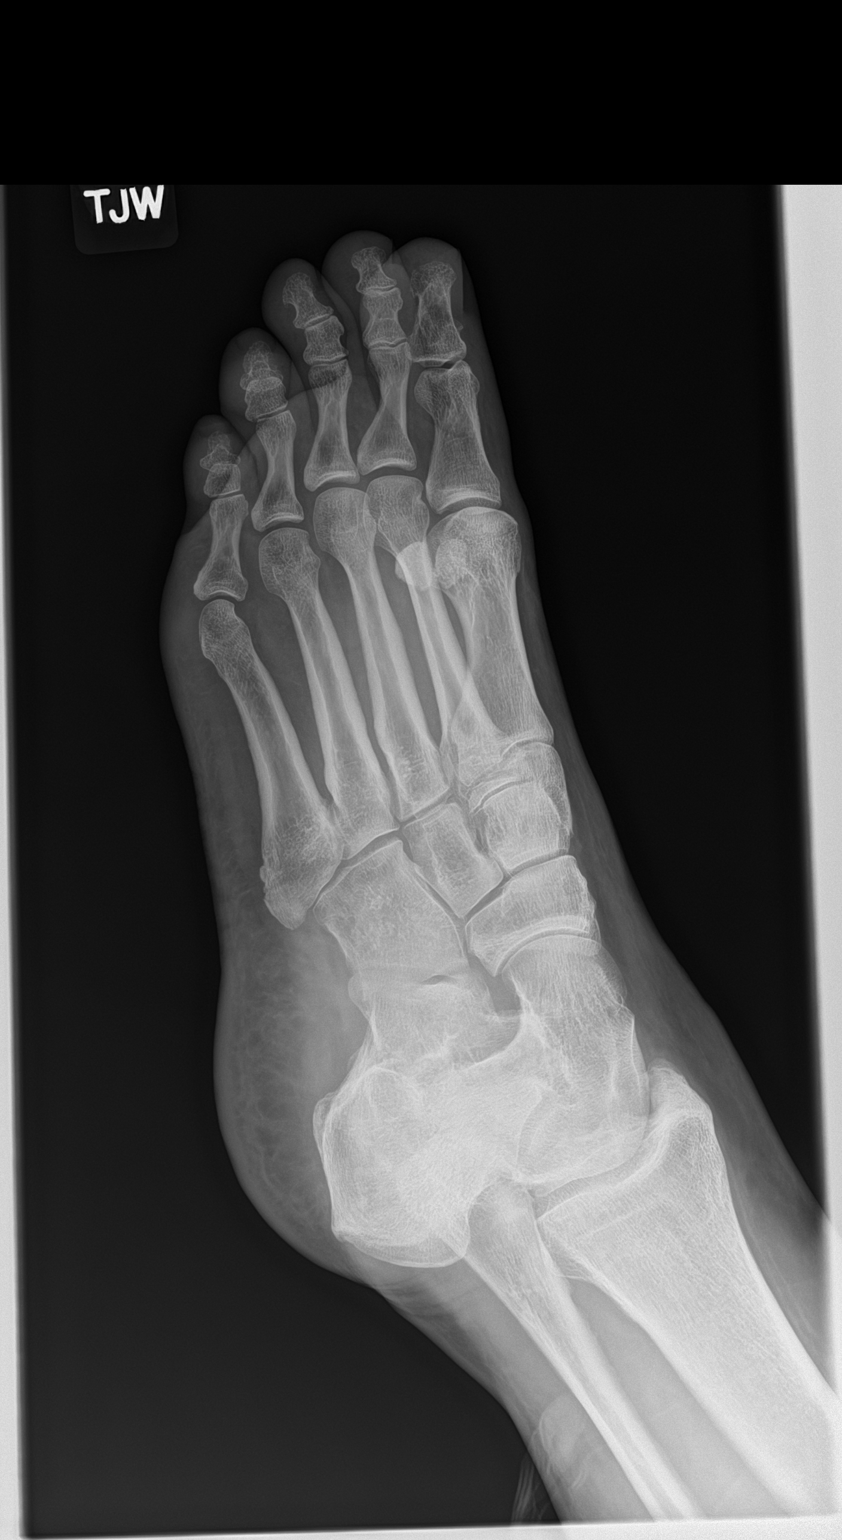

[foot lat]
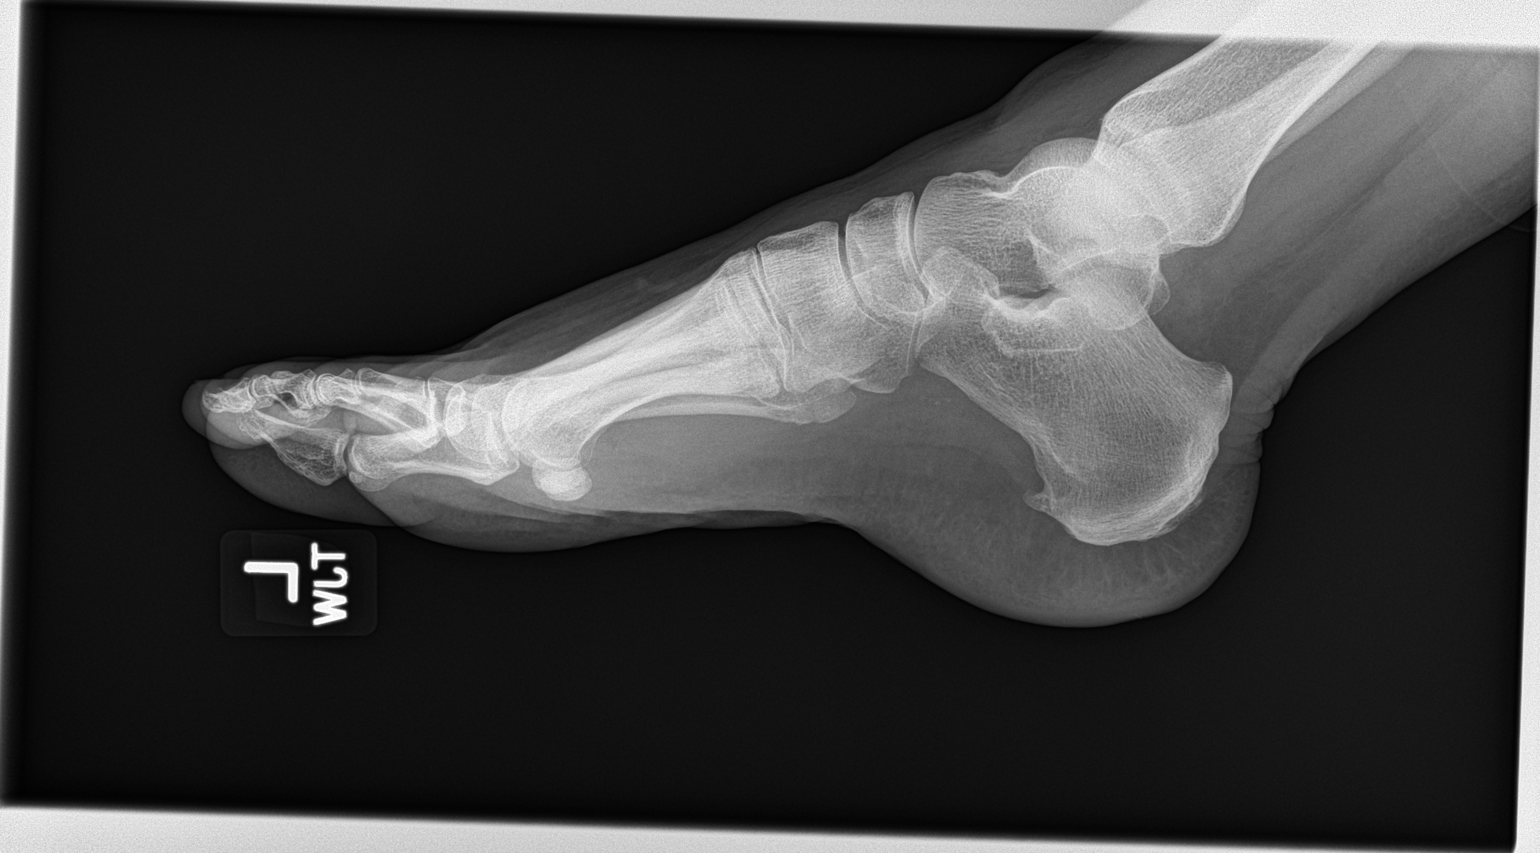

[3 of 3 positions shown; findings below may reference images not displayed]

FINDINGS: Early joint space narrowing in the 1st MTP joint. Plantar calcaneal
spur. No acute bony abnormality. Specifically, no fracture,
subluxation, or dislocation.
IMPRESSION: No acute bony abnormality.  Small calcaneal spur.

## 2019-11-06 ENCOUNTER — Other Ambulatory Visit: Payer: Self-pay | Admitting: Primary Care

## 2019-11-06 DIAGNOSIS — I10 Essential (primary) hypertension: Secondary | ICD-10-CM

## 2019-12-21 ENCOUNTER — Other Ambulatory Visit: Payer: Self-pay | Admitting: Primary Care

## 2019-12-21 DIAGNOSIS — I10 Essential (primary) hypertension: Secondary | ICD-10-CM

## 2019-12-21 DIAGNOSIS — E785 Hyperlipidemia, unspecified: Secondary | ICD-10-CM

## 2019-12-29 ENCOUNTER — Other Ambulatory Visit: Payer: BC Managed Care – PPO

## 2019-12-31 ENCOUNTER — Other Ambulatory Visit (INDEPENDENT_AMBULATORY_CARE_PROVIDER_SITE_OTHER): Payer: BC Managed Care – PPO

## 2019-12-31 ENCOUNTER — Other Ambulatory Visit: Payer: Self-pay

## 2019-12-31 DIAGNOSIS — I10 Essential (primary) hypertension: Secondary | ICD-10-CM

## 2019-12-31 DIAGNOSIS — E785 Hyperlipidemia, unspecified: Secondary | ICD-10-CM

## 2019-12-31 LAB — COMPREHENSIVE METABOLIC PANEL
ALT: 31 U/L (ref 0–35)
AST: 29 U/L (ref 0–37)
Albumin: 4.4 g/dL (ref 3.5–5.2)
Alkaline Phosphatase: 76 U/L (ref 39–117)
BUN: 16 mg/dL (ref 6–23)
CO2: 31 mEq/L (ref 19–32)
Calcium: 9.6 mg/dL (ref 8.4–10.5)
Chloride: 98 mEq/L (ref 96–112)
Creatinine, Ser: 0.91 mg/dL (ref 0.40–1.20)
GFR: 63.55 mL/min (ref >60.00)
Glucose, Bld: 118 mg/dL — ABNORMAL HIGH (ref 70–99)
Potassium: 3.3 mEq/L — ABNORMAL LOW (ref 3.5–5.1)
Sodium: 140 mEq/L (ref 135–145)
Total Bilirubin: 1 mg/dL (ref 0.2–1.2)
Total Protein: 7.4 g/dL (ref 6.0–8.3)

## 2019-12-31 LAB — LIPID PANEL
Cholesterol: 166 mg/dL (ref 0–200)
HDL: 59.6 mg/dL (ref >39.00)
NonHDL: 106.46
Total CHOL/HDL Ratio: 3
Triglycerides: 202 mg/dL — ABNORMAL HIGH (ref 0.0–149.0)
VLDL: 40.4 mg/dL — ABNORMAL HIGH (ref 0.0–40.0)

## 2019-12-31 LAB — CBC
HCT: 43.3 % (ref 36.0–46.0)
Hemoglobin: 14.6 g/dL (ref 12.0–15.0)
MCHC: 33.7 g/dL (ref 30.0–36.0)
MCV: 90 fl (ref 78.0–100.0)
Platelets: 276 10*3/uL (ref 150.0–400.0)
RBC: 4.81 Mil/uL (ref 3.87–5.11)
RDW: 13.7 % (ref 11.5–15.5)
WBC: 7.3 10*3/uL (ref 4.0–10.5)

## 2019-12-31 LAB — LDL CHOLESTEROL, DIRECT: Direct LDL: 87 mg/dL

## 2020-01-02 ENCOUNTER — Encounter: Payer: BC Managed Care – PPO | Admitting: Primary Care

## 2020-01-07 ENCOUNTER — Encounter: Payer: Self-pay | Admitting: Primary Care

## 2020-01-07 ENCOUNTER — Other Ambulatory Visit: Payer: Self-pay

## 2020-01-07 ENCOUNTER — Ambulatory Visit (INDEPENDENT_AMBULATORY_CARE_PROVIDER_SITE_OTHER): Payer: BC Managed Care – PPO | Admitting: Primary Care

## 2020-01-07 VITALS — BP 124/82 | HR 69 | Temp 97.4°F | Ht 68.0 in | Wt 225.2 lb

## 2020-01-07 DIAGNOSIS — Z0000 Encounter for general adult medical examination without abnormal findings: Secondary | ICD-10-CM

## 2020-01-07 DIAGNOSIS — I10 Essential (primary) hypertension: Secondary | ICD-10-CM

## 2020-01-07 DIAGNOSIS — R739 Hyperglycemia, unspecified: Secondary | ICD-10-CM

## 2020-01-07 DIAGNOSIS — Z9109 Other allergy status, other than to drugs and biological substances: Secondary | ICD-10-CM

## 2020-01-07 LAB — POCT GLYCOSYLATED HEMOGLOBIN (HGB A1C): Hemoglobin A1C: 5.5 % (ref 4.0–5.6)

## 2020-01-07 NOTE — Assessment & Plan Note (Signed)
Immunizations UTD. Pap smear UTD. Mammogram UTD. Colonoscopy UTD. Encouraged a healthy diet with regular exercise. Exam today unremarkable. Labs pending.

## 2020-01-07 NOTE — Patient Instructions (Addendum)
Start exercising. You should be getting 150 minutes of moderate intensity exercise weekly.  It's important to improve your diet by reducing consumption of fast food, fried food, processed snack foods, sugary drinks. Increase consumption of fresh vegetables and fruits, whole grains, water.  Ensure you are drinking 64 ounces of water daily.  Covid-19 testing is by appointment only at this time.   You can schedule an appointment by going online to [email or web address], texting COVID to [phone], or by calling [phone].  Testing sites include:  [facility] ([address], [place], [place]) [facility] in [place], [place]  It was a pleasure to see you today!   Preventive Care 75-2 Years Old, Female Preventive care refers to visits with your health care provider and lifestyle choices that can promote health and wellness. This includes:  A yearly physical exam. This may also be called an annual well check.  Regular dental visits and eye exams.  Immunizations.  Screening for certain conditions.  Healthy lifestyle choices, such as eating a healthy diet, getting regular exercise, not using drugs or products that contain nicotine and tobacco, and limiting alcohol use. What can I expect for my preventive care visit? Physical exam Your health care provider will check your:  Height and weight. This may be used to calculate body mass index (BMI), which tells if you are at a healthy weight.  Heart rate and blood pressure.  Skin for abnormal spots. Counseling Your health care provider may ask you questions about your:  Alcohol, tobacco, and drug use.  Emotional well-being.  Home and relationship well-being.  Sexual activity.  Eating habits.  Work and work Statistician.  Method of birth control.  Menstrual cycle.  Pregnancy history. What immunizations do I need?  Influenza (flu) vaccine  This is recommended every  year. Tetanus, diphtheria, and pertussis (Tdap) vaccine  You may need a Td booster every 10 years. Varicella (chickenpox) vaccine  You may need this if you have not been vaccinated. Zoster (shingles) vaccine  You may need this after age 62. Measles, mumps, and rubella (MMR) vaccine  You may need at least one dose of MMR if you were born in 1957 or later. You may also need a second dose. Pneumococcal conjugate (PCV13) vaccine  You may need this if you have certain conditions and were not previously vaccinated. Pneumococcal polysaccharide (PPSV23) vaccine  You may need one or two doses if you smoke cigarettes or if you have certain conditions. Meningococcal conjugate (MenACWY) vaccine  You may need this if you have certain conditions. Hepatitis A vaccine  You may need this if you have certain conditions or if you travel or work in places where you may be exposed to hepatitis A. Hepatitis B vaccine  You may need this if you have certain conditions or if you travel or work in places where you may be exposed to hepatitis B. Haemophilus influenzae type b (Hib) vaccine  You may need this if you have certain conditions. Human papillomavirus (HPV) vaccine  If recommended by your health care provider, you may need three doses over 6 months. You may receive vaccines as individual doses or as more than one vaccine together in one shot (combination vaccines). Talk with your health care provider about the risks and benefits of combination vaccines. What tests do I need? Blood tests  Lipid and cholesterol levels. These may be checked every 5 years, or more frequently if you are over 13 years old.  Hepatitis  C test.  Hepatitis B test. Screening  Lung cancer screening. You may have this screening every year starting at age 63 if you have a 30-pack-year history of smoking and currently smoke or have quit within the past 15 years.  Colorectal cancer screening. All adults should have this  screening starting at age 44 and continuing until age 62. Your health care provider may recommend screening at age 17 if you are at increased risk. You will have tests every 1-10 years, depending on your results and the type of screening test.  Diabetes screening. This is done by checking your blood sugar (glucose) after you have not eaten for a while (fasting). You may have this done every 1-3 years.  Mammogram. This may be done every 1-2 years. Talk with your health care provider about when you should start having regular mammograms. This may depend on whether you have a family history of breast cancer.  BRCA-related cancer screening. This may be done if you have a family history of breast, ovarian, tubal, or peritoneal cancers.  Pelvic exam and Pap test. This may be done every 3 years starting at age 62. Starting at age 52, this may be done every 5 years if you have a Pap test in combination with an HPV test. Other tests  Sexually transmitted disease (STD) testing.  Bone density scan. This is done to screen for osteoporosis. You may have this scan if you are at high risk for osteoporosis. Follow these instructions at home: Eating and drinking  Eat a diet that includes fresh fruits and vegetables, whole grains, lean protein, and low-fat dairy.  Take vitamin and mineral supplements as recommended by your health care provider.  Do not drink alcohol if: ? Your health care provider tells you not to drink. ? You are pregnant, may be pregnant, or are planning to become pregnant.  If you drink alcohol: ? Limit how much you have to 0-1 drink a day. ? Be aware of how much alcohol is in your drink. In the [place] one drink equals one 12 oz bottle of beer (355 mL), one 5 oz glass of wine (148 mL), or one 1 oz glass of hard liquor (44 mL). Lifestyle  Take daily care of your teeth and gums.  Stay active. Exercise for at least 30 minutes on 5 or more days each week.  Do not use any products that  contain nicotine or tobacco, such as cigarettes, e-cigarettes, and chewing tobacco. If you need help quitting, ask your health care provider.  If you are sexually active, practice safe sex. Use a condom or other form of birth control (contraception) in order to prevent pregnancy and STIs (sexually transmitted infections).  If told by your health care provider, take low-dose aspirin daily starting at age 55. What's next?  Visit your health care provider once a year for a well check visit.  Ask your health care provider how often you should have your eyes and teeth checked.  Stay up to date on all vaccines. This information is not intended to replace advice given to you by your health care provider. Make sure you discuss any questions you have with your health care provider. Document Revised: 07/18/2018 Document Reviewed: 07/18/2018 Elsevier Patient Education  2020 [employer].

## 2020-01-07 NOTE — Progress Notes (Signed)
Subjective:    Patient ID: Taylor Mullins, female    DOB: [birth year], 59 y.o.   MRN: [id number]  HPI  Ms. Taylor Mullins is a 59 year old female who presents today for complete physical. She will be going to see her elderly parents next week and is needing Covid testing.   Immunizations: -Tetanus: Completed in 2015 -Influenza: Completed this season  -Shingles: Completed Shingrix  Diet: She endorses a poor diet.  Exercise: She is not exericising   Eye exam: Completes annually  Dental exam: Completes annually   Pap Smear: Follows with GYN Mammogram: Completes annually, follows with GYN. Colonoscopy: Completed in 2019, due in 2024 Hep C Screen: Negative   BP Readings from Last 3 Encounters:  01/07/20 124/82  01/13/19 126/87  12/27/18 134/84     Review of Systems  Constitutional: Negative for unexpected weight change.  HENT: Negative for rhinorrhea.   Respiratory: Negative for cough and shortness of breath.   Cardiovascular: Negative for chest pain.  Gastrointestinal: Negative for constipation and diarrhea.  Genitourinary: Negative for difficulty urinating.  Musculoskeletal: Negative for arthralgias and myalgias.  Skin: Negative for rash.  Allergic/Immunologic: Positive for environmental allergies.  Neurological: Negative for dizziness and numbness.  Psychiatric/Behavioral:       Stressors with work, also with elderly parents        Past Medical History:  Diagnosis Date  . Essential hypertension   . Ganglion cyst    right ankle  . Migraine    occasionally   . TMJ dysfunction   . Urinary problem   . Urticaria      Social History   Socioeconomic History  . Marital status: Married    Spouse name: Not on file  . Number of children: Not on file  . Years of education: Not on file  . Highest education level: Not on file  Occupational History  . Not on file  Tobacco Use  . Smoking status: Never Smoker  . Smokeless tobacco: Never Used  Substance and Sexual  Activity  . Alcohol use: Yes    Comment: rarely  . Drug use: No  . Sexual activity: Not on file  Other Topics Concern  . Not on file  Social History Narrative   Married.   3 children.    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    Social Determinants of Health   Financial Resource Strain:   . Difficulty of Paying Living Expenses: Not on file  Food Insecurity:   . Worried About Programme researcher, broadcasting/film/video in the Last Year: Not on file  . Ran Out of Food in the Last Year: Not on file  Transportation Needs:   . Lack of Transportation (Medical): Not on file  . Lack of Transportation (Non-Medical): Not on file  Physical Activity:   . Days of Exercise per Week: Not on file  . Minutes of Exercise per Session: Not on file  Stress:   . Feeling of Stress : Not on file  Social Connections:   . Frequency of Communication with Friends and Family: Not on file  . Frequency of Social Gatherings with Friends and Family: Not on file  . Attends Religious Services: Not on file  . Active Member of Clubs or Organizations: Not on file  . Attends Banker Meetings: Not on file  . Marital Status: Not on file  Intimate Partner Violence:   . Fear of Current or Ex-Partner: Not  on file  . Emotionally Abused: Not on file  . Physically Abused: Not on file  . Sexually Abused: Not on file    Past Surgical History:  Procedure Laterality Date  . ABDOMINAL HYSTERECTOMY    . ABLATION    . ANTERIOR AND POSTERIOR REPAIR N/A 06/19/2017   Procedure: ANTERIOR (CYSTOCELE) AND POSTERIOR REPAIR (RECTOCELE);  Surgeon: Bjorn Loser, MD;  Location: [facility];  Service: Urology;  Laterality: N/A;  . CESAREAN SECTION  [phone]  . CYSTOSCOPY N/A 06/19/2017   Procedure: CYSTOSCOPY;  Surgeon: Bjorn Loser, MD;  Location: [facility];  Service: Urology;  Laterality: N/A;  . VAGINAL HYSTERECTOMY N/A 06/19/2017   Procedure: HYSTERECTOMY VAGINAL TOTAL;  Surgeon: Olga Millers, MD;  Location: [facility];  Service: Gynecology;  Laterality: N/A;    Family History  Problem Relation Age of Onset  . Colon cancer Mother   . Hypertension Mother   . Hyperlipidemia Mother   . Crohn's disease Father   . Heart disease Father     Allergies  Allergen Reactions  . 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 aspirin 81 MG EC tablet Take 81 mg by mouth daily. Takes in the evening    . cetirizine (ZYRTEC) 10 MG tablet Take 10 mg by mouth every evening.     Marland Kitchen EPINEPHRINE, ANAPHYLAXIS, IJ Inject as directed as needed.     . hydrochlorothiazide (HYDRODIURIL) 25 MG tablet TAKE 1 TABLET BY MOUTH EVERY MORNING TO CONTROL HIGH BLOOD PRESSURE 90 tablet 0  . metoprolol tartrate (LOPRESSOR) 25 MG tablet TAKE 1 TABLET BY MOUTH TWICE A DAY 180 tablet 1  . simvastatin (ZOCOR) 20 MG tablet Take 1 tablet (20 mg total) by mouth daily. For cholesterol. 90 tablet 3  . UNABLE TO FIND Gets weekly shots for allergies     No current facility-administered medications on file prior to visit.    BP 124/82   Pulse 69   Temp (!) 97.4 F (36.3 C) (Temporal)   Ht 5\' 8"  (1.727 m)   Wt 225 lb 4 oz (102.2 kg)   SpO2 98%   BMI 34.25 kg/m    Objective:   Physical Exam  Constitutional: She is oriented to person, place, and time. She appears well-nourished.  HENT:  Right Ear: Tympanic membrane and ear canal normal.  Left Ear: Tympanic membrane and ear canal normal.  Mouth/Throat: Oropharynx is clear and moist.  Eyes: Pupils are equal, round, and reactive to light. EOM are normal.  Cardiovascular: Normal rate and regular rhythm.  Respiratory: Effort normal and breath sounds normal.  GI: Soft. Bowel sounds are normal. There is no abdominal tenderness.  Musculoskeletal:        General: Normal  range of motion.     Cervical back: Neck supple.  Neurological: She is alert and oriented to person, place, and time. No cranial nerve deficit.  Reflex Scores:      Patellar reflexes are 2+ on the right side and 2+ on the left side. Skin: Skin is warm and dry.  Psychiatric: She has a normal mood and affect.           Assessment & Plan:

## 2020-01-07 NOTE — Assessment & Plan Note (Signed)
Stable in the office today, continue HCTZ and metoprolol tartrate. CMP reviewed.

## 2020-01-07 NOTE — Assessment & Plan Note (Signed)
Following with allergist, doing well on current regimen, continue same.

## 2020-01-13 ENCOUNTER — Ambulatory Visit: Payer: BC Managed Care – PPO | Attending: Internal Medicine

## 2020-01-13 DIAGNOSIS — Z20822 Contact with and (suspected) exposure to covid-19: Secondary | ICD-10-CM

## 2020-01-14 LAB — NOVEL CORONAVIRUS, NAA: SARS-CoV-2, NAA: NOT DETECTED

## 2020-01-15 ENCOUNTER — Other Ambulatory Visit: Payer: Self-pay | Admitting: Primary Care

## 2020-01-15 DIAGNOSIS — E785 Hyperlipidemia, unspecified: Secondary | ICD-10-CM

## 2020-01-18 ENCOUNTER — Other Ambulatory Visit: Payer: Self-pay

## 2020-01-18 ENCOUNTER — Ambulatory Visit: Payer: BC Managed Care – PPO | Attending: Internal Medicine

## 2020-01-18 DIAGNOSIS — Z23 Encounter for immunization: Secondary | ICD-10-CM | POA: Insufficient documentation

## 2020-01-18 NOTE — Progress Notes (Signed)
   Covid-19 Vaccination Clinic  Name:  SEMAYA VIDA    MRN: [id number] DOB: [birth year]  01/18/2020  Taylor Mullins was observed post Covid-19 immunization for 30 minutes based on pre-vaccination screening without incidence. She was provided with Vaccine Information Sheet and instruction to access the V-Safe system.   Ms. Dier was instructed to call 911 with any severe reactions post vaccine: Marland Kitchen Difficulty breathing  . Swelling of your face and throat  . A fast heartbeat  . A bad rash all over your body  . Dizziness and weakness    Immunizations Administered    Name Date Dose VIS Date Route   Moderna COVID-19 Vaccine 01/18/2020  2:27 PM 0.5 mL 10/21/2019 Intramuscular   Manufacturer: Moderna   Lot: [id number]   NDC: 53912-258-34

## 2020-01-29 ENCOUNTER — Other Ambulatory Visit: Payer: Self-pay | Admitting: Primary Care

## 2020-01-29 DIAGNOSIS — I10 Essential (primary) hypertension: Secondary | ICD-10-CM

## 2020-02-02 ENCOUNTER — Other Ambulatory Visit: Payer: Self-pay | Admitting: Primary Care

## 2020-02-02 DIAGNOSIS — I10 Essential (primary) hypertension: Secondary | ICD-10-CM

## 2020-02-21 ENCOUNTER — Ambulatory Visit: Payer: BC Managed Care – PPO | Attending: Internal Medicine

## 2020-02-21 ENCOUNTER — Ambulatory Visit: Payer: BC Managed Care – PPO

## 2020-02-21 DIAGNOSIS — Z23 Encounter for immunization: Secondary | ICD-10-CM

## 2020-02-21 NOTE — Progress Notes (Signed)
   Covid-19 Vaccination Clinic  Name:  REBACCA VOTAW    MRN: [id number] DOB: 1961-07-11  02/21/2020  Ms. Eugene was observed post Covid-19 immunization for 30 minutes based on pre-vaccination screening without incident. She was provided with Vaccine Information Sheet and instruction to access the V-Safe system.   Ms. Cape was instructed to call 911 with any severe reactions post vaccine: Marland Kitchen Difficulty breathing  . Swelling of face and throat  . A fast heartbeat  . A bad rash all over body  . Dizziness and weakness   Immunizations Administered    Name Date Dose VIS Date Route   Moderna COVID-19 Vaccine 02/21/2020  9:53 AM 0.5 mL 10/21/2019 Intramuscular   Manufacturer: Moderna   Lot: [id number]   NDC: 33174-099-27

## 2020-04-26 ENCOUNTER — Other Ambulatory Visit: Payer: Self-pay

## 2020-04-26 ENCOUNTER — Encounter: Payer: Self-pay | Admitting: Podiatry

## 2020-04-26 ENCOUNTER — Ambulatory Visit: Payer: BC Managed Care – PPO | Admitting: Podiatry

## 2020-04-26 VITALS — Temp 96.6°F

## 2020-04-26 DIAGNOSIS — M722 Plantar fascial fibromatosis: Secondary | ICD-10-CM

## 2020-04-26 NOTE — Progress Notes (Signed)
Subjective:   Patient ID: Taylor Mullins, female   DOB: 59 y.o.   MRN: [id number]   HPI Patient presents stating my heel has really been bothering me left and it did very well for at least 6 months    ROS      Objective:  Physical Exam  Neurovascular status intact with exquisite discomfort left plantar fascia at the insertional point tendon calcaneus     Assessment:  Acute plantar fasciitis left     Plan:  Sterile prep injected the fascia 3 mg Kenalog 5 mg liken advised on reduced activity reappoint to recheck

## 2020-09-21 ENCOUNTER — Other Ambulatory Visit: Payer: Self-pay | Admitting: Primary Care

## 2020-09-21 DIAGNOSIS — I10 Essential (primary) hypertension: Secondary | ICD-10-CM

## 2020-09-21 NOTE — Telephone Encounter (Signed)
Last OV 01/07/20 Last fill 02/03/20  #180/1

## 2020-10-02 ENCOUNTER — Other Ambulatory Visit: Payer: Self-pay | Admitting: Primary Care

## 2020-10-02 DIAGNOSIS — E785 Hyperlipidemia, unspecified: Secondary | ICD-10-CM

## 2020-12-23 ENCOUNTER — Other Ambulatory Visit: Payer: Self-pay | Admitting: Primary Care

## 2020-12-23 DIAGNOSIS — I10 Essential (primary) hypertension: Secondary | ICD-10-CM

## 2020-12-23 DIAGNOSIS — E785 Hyperlipidemia, unspecified: Secondary | ICD-10-CM

## 2020-12-27 ENCOUNTER — Other Ambulatory Visit: Payer: Self-pay | Admitting: Primary Care

## 2020-12-27 DIAGNOSIS — E785 Hyperlipidemia, unspecified: Secondary | ICD-10-CM

## 2020-12-28 ENCOUNTER — Encounter (HOSPITAL_COMMUNITY): Payer: Self-pay

## 2020-12-28 ENCOUNTER — Telehealth: Payer: Self-pay

## 2020-12-28 ENCOUNTER — Ambulatory Visit (HOSPITAL_COMMUNITY)
Admission: EM | Admit: 2020-12-28 | Discharge: 2020-12-28 | Disposition: A | Discharge status: Home / Self Care | Payer: BC Managed Care – PPO | Attending: Student | Admitting: Student

## 2020-12-28 ENCOUNTER — Other Ambulatory Visit: Payer: Self-pay

## 2020-12-28 DIAGNOSIS — R42 Dizziness and giddiness: Secondary | ICD-10-CM | POA: Diagnosis not present

## 2020-12-28 DIAGNOSIS — I10 Essential (primary) hypertension: Secondary | ICD-10-CM | POA: Insufficient documentation

## 2020-12-28 DIAGNOSIS — Z20822 Contact with and (suspected) exposure to covid-19: Secondary | ICD-10-CM | POA: Insufficient documentation

## 2020-12-28 LAB — SARS CORONAVIRUS 2 (TAT 6-24 HRS): SARS Coronavirus 2: NEGATIVE

## 2020-12-28 NOTE — Telephone Encounter (Signed)
Pt was at work at [employer] and pt was sitting and felt like she was going to pass out; no warning or noted symptoms from pt before feeling like going to pass out; pt did not actually lose consciousness. Pt said she was lightheaded and not sure if room was spinning. Pt also felt clammy. Pt was walked to nurse office and BP was 140/90 P 85. Nurse took pulse while pt on phone P 64.  Pt said she has not missed any of her BP meds. Pt said she is not diabetic. Pt request appt to be seen at [facility]. pts husband was dx with covid on 12/24/20 and pt has not been quarantining since she is fully vaccinated per pt and has no symptoms. I spoke with Mayra Reel NP who said pt would need face to face visit with labs and EKG. She asked for me to ck with Carollee Herter RN to see if pt could come in [facility]. Carollee Herter RN said pt would need to go to [facility] for eval. Pt voiced understanding and will go to [facility] when her husbands picks her up.FYI to Allayne Gitelman NP.

## 2020-12-28 NOTE — ED Provider Notes (Signed)
[facility]    CSN: [id number] Arrival date & time: 12/28/20  1422      History   Chief Complaint Chief Complaint  Patient presents with  . Dizziness  . Tingling in Hands  . Chest Tightness    HPI Taylor Mullins is a 60 y.o. female presenting with chest tightness x6 hours following positive covid exposure. History essential hypertension, tmj dysfunction. No other history of cardiopulmonary disease. States her blood pressure is reasonably well controlled on hctz and lopressor. Patient endorses few minutes of lightheadedness and tingling in bilateral hands this morning that went away on its own. She also endorses generalized chest tightness since this morning that has persisted. Denies exacerbation of pain with movement, breathing, coughing. Denies vision changes, headaches, left-sided chest pain, shortness of breath, weakness or sensation changes in one arm or leg, current dizziness, current tingling in hands, LOC. Denies fevers/chills, n/v/d, shortness of breath, cough, congestion, facial pain, teeth pain, headaches, sore throat, loss of taste/smell, swollen lymph nodes, ear pain.   This patient has had covid vaccine and booster but does have positive exposure to covid at home.   Family history of hypertension in mother and MI in father.   HPI  Past Medical History:  Diagnosis Date  . Essential hypertension   . Ganglion cyst    right ankle  . Migraine    occasionally   . TMJ dysfunction   . Urinary problem   . Urticaria     Patient Active Problem List   Diagnosis Date Noted  . 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/22/2019  . Cystocele, midline 06/19/2017  . Preventative health care 12/19/2016  . Essential hypertension 09/18/2016  . Recurrent UTI 09/18/2016    Past Surgical History:  Procedure Laterality Date  . ABDOMINAL HYSTERECTOMY    . ABLATION    . ANTERIOR AND POSTERIOR REPAIR N/A 06/19/2017   Procedure: ANTERIOR (CYSTOCELE) AND POSTERIOR REPAIR  (RECTOCELE);  Surgeon: Alfredo Martinez, MD;  Location: [facility];  Service: Urology;  Laterality: N/A;  . CESAREAN SECTION  [phone]  . CYSTOSCOPY N/A 06/19/2017   Procedure: CYSTOSCOPY;  Surgeon: Alfredo Martinez, MD;  Location: [facility];  Service: Urology;  Laterality: N/A;  . VAGINAL HYSTERECTOMY N/A 06/19/2017   Procedure: HYSTERECTOMY VAGINAL TOTAL;  Surgeon: Levi Aland, MD;  Location: [facility];  Service: Gynecology;  Laterality: N/A;    OB History   No obstetric history on file.      Home Medications    Prior to Admission medications   Medication Sig Start Date End Date Taking? Authorizing Provider  aspirin 81 MG EC tablet Take 81 mg by mouth daily. Takes in the evening    [provider]  cetirizine (ZYRTEC) 10 MG tablet Take 10 mg by mouth every evening.     [provider]  EPINEPHRINE, ANAPHYLAXIS, IJ Inject as directed as needed.     [provider]  hydrochlorothiazide (HYDRODIURIL) 25 MG tablet TAKE 1 TABLET BY MOUTH EVERY MORNING TO CONTROL HIGH BLOOD PRESSURE 01/29/20   Doreene Nest, NP  metoprolol tartrate (LOPRESSOR) 25 MG tablet TAKE 1 TABLET BY MOUTH TWICE A DAY 09/21/20   Doreene Nest, NP  simvastatin (ZOCOR) 20 MG tablet TAKE 1 TABLET (20 MG TOTAL) BY MOUTH DAILY. FOR CHOLESTEROL. 12/27/20   Doreene Nest, NP  UNABLE TO FIND Gets weekly shots for allergies    [provider]    Family History Family History  Problem Relation Age of Onset  .  Colon cancer Mother   . Hypertension Mother   . Hyperlipidemia Mother   . Crohn's disease Father   . Heart disease Father     Social History Social History   Tobacco Use  . Smoking status: Never Smoker  . Smokeless tobacco: Never Used  Vaping Use  . Vaping Use: Never used  Substance Use Topics  . Alcohol use: Yes    Comment: rarely  . Drug use: No     Allergies   Shrimp [shellfish allergy]   Review of Systems Review of Systems  Constitutional: Negative for  appetite change, chills and fever.  HENT: Negative for congestion, ear pain, rhinorrhea, sinus pressure, sinus pain and sore throat.   Eyes: Negative for redness and visual disturbance.  Respiratory: Positive for chest tightness. Negative for cough, shortness of breath and wheezing.   Cardiovascular: Negative for chest pain and palpitations.  Gastrointestinal: Negative for abdominal pain, constipation, diarrhea, nausea and vomiting.  Genitourinary: Negative for dysuria, frequency and urgency.  Musculoskeletal: Negative for myalgias.  Neurological: Negative for dizziness, weakness and headaches.  Psychiatric/Behavioral: Negative for confusion.  All other systems reviewed and are negative.    Physical Exam Triage Vital Signs ED Triage Vitals  Enc Vitals Group     BP 12/28/20 1444 (!) 161/90     Pulse Rate 12/28/20 1444 83     Resp 12/28/20 1444 18     Temp 12/28/20 1444 98.2 F (36.8 C)     Temp Source 12/28/20 1444 Oral     SpO2 12/28/20 1444 99 %     Weight --      Height --      Head Circumference --      Peak Flow --      Pain Score 12/28/20 1441 1     Pain Loc --      Pain Edu? --      Excl. in GC? --    No data found.  Updated Vital Signs BP (!) 161/90 (BP Location: Right Arm)   Pulse 83   Temp 98.2 F (36.8 C) (Oral)   Resp 18   SpO2 99%   Visual Acuity Right Eye Distance:   Left Eye Distance:   Bilateral Distance:    Right Eye Near:   Left Eye Near:    Bilateral Near:     Physical Exam Vitals reviewed.  Constitutional:      General: She is not in acute distress.    Appearance: Normal appearance. She is not ill-appearing.  HENT:     Head: Normocephalic and atraumatic.     Right Ear: Hearing, tympanic membrane, ear canal and external ear normal. No swelling or tenderness. There is no impacted cerumen. No mastoid tenderness. Tympanic membrane is not perforated, erythematous, retracted or bulging.     Left Ear: Hearing, tympanic membrane, ear canal and  external ear normal. No swelling or tenderness. There is no impacted cerumen. No mastoid tenderness. Tympanic membrane is not perforated, erythematous, retracted or bulging.     Nose:     Right Sinus: No maxillary sinus tenderness or frontal sinus tenderness.     Left Sinus: No maxillary sinus tenderness or frontal sinus tenderness.     Mouth/Throat:     Mouth: Mucous membranes are moist.     Pharynx: Uvula midline. No oropharyngeal exudate or posterior oropharyngeal erythema.     Tonsils: No tonsillar exudate.  Eyes:     Extraocular Movements: Extraocular movements intact.     Pupils: Pupils  are equal, round, and reactive to light.  Cardiovascular:     Rate and Rhythm: Normal rate and regular rhythm.     Pulses:          Radial pulses are 2+ on the right side and 2+ on the left side.     Heart sounds: Normal heart sounds.  Pulmonary:     Effort: Pulmonary effort is normal.     Breath sounds: Normal breath sounds and air entry. No wheezing, rhonchi or rales.  Chest:     Chest wall: No tenderness.  Abdominal:     General: Abdomen is flat. Bowel sounds are normal.     Tenderness: There is no abdominal tenderness. There is no guarding or rebound.  Musculoskeletal:     Cervical back: Normal range of motion and neck supple. No rigidity.     Right lower leg: No edema.     Left lower leg: No edema.  Lymphadenopathy:     Cervical: No cervical adenopathy.  Neurological:     General: No focal deficit present.     Mental Status: She is alert and oriented to person, place, and time. Mental status is at baseline.     Cranial Nerves: Cranial nerves are intact. No cranial nerve deficit or facial asymmetry.     Sensory: Sensation is intact. No sensory deficit.     Motor: Motor function is intact. No weakness.     Coordination: Coordination is intact. Coordination normal.     Gait: Gait is intact. Gait normal.     Comments: CN 2-12 intact. No weakness or numbness in UEs or LEs. Sensation intact  UEs and LEs.   Psychiatric:        Attention and Perception: Attention and perception normal.        Mood and Affect: Mood and affect normal.        Behavior: Behavior normal. Behavior is cooperative.        Thought Content: Thought content normal.        Judgment: Judgment normal.      UC Treatments / Results  Labs (all labs ordered are listed, but only abnormal results are displayed) Labs Reviewed  SARS CORONAVIRUS 2 (TAT 6-24 HRS)    EKG   Radiology No results found.  Procedures Procedures (including critical care time)  Medications Ordered in UC Medications - No data to display  Initial Impression / Assessment and Plan / UC Course  I have reviewed the triage vital signs and the nursing notes.  Pertinent labs & imaging results that were available during my care of the patient were reviewed by me and considered in my medical decision making (see chart for details).     This patient is a 60 year old female presenting with chest tightness  x6 hours and brief episode of lightheadedness and bilateral hand tingling this morning. History hypertension reasonably well controlled on Lopressor and hctz. BP 160/90 today, she denies left-sided chest pain, pain down left arm, headaches, vision changes, current tingling in arms, dizziness. Neuro exam wnl. Of note, this patient lives with a covid positive individual; she is fully vaccinated.   afebrile nontachycardic nontachypneic, oxygenating well on room air. No wheezes rhonchi or rales. No pedal edema or calf tenderness.   EKG today NSR, unchanged from 2018 EKG.   I suspect this patient has [phone], given her positive exposure to covid at home.  Plan to discharge this patient to home with STRICT return precautions.  Head to ED immediately if you  develop left-sided chest pain at rest, left arm pain, dizziness that doesn't go away on its own, tingling or weakness in one arm/leg, etc. She verbalizes understanding and agreement.    Spent over 40 minutes obtaining H&P, performing physical, interpreting EKG, discussing results, treatment plan and plan for follow-up with patient. Patient agrees with plan. Discussed treatment plan with attending physician Dr. Tracie Harrier.  This chart was dictated using voice recognition software, Dragon. Despite the best efforts of this provider to proofread and correct errors, errors may still occur which can change documentation meaning.   Final Clinical Impressions(s) / UC Diagnoses   Final diagnoses:  Essential hypertension  Dizziness  Exposure to confirmed case of COVID-19     Discharge Instructions     -For fevers/chills, body aches, headaches- use Tylenol and Ibuprofen. You can alternate these for maximum effect. Use up to 3000mg  Tylenol daily and 3200mg  Ibuprofen daily. Make sure to take ibuprofen with food. Check the bottle of ibuprofen/tylenol for specific dosage instructions. -Head to ED immediately if you develop left-sided chest pain at rest, left arm pain, dizziness that doesn't go away on its own, tingling or weakness in one arm/leg, etc.   We are currently awaiting result of your PCR covid-19 test. This typically comes back in 1-2 days. We'll call you if the result is positive. Otherwise, the result will be sent electronically to your MyChart. You can also call this clinic and ask for your result via telephone.   Please isolate at home while awaiting these results. If your test is positive for Covid-19, continue to isolate at home for 5 days if you have mild symptoms, or a total of 10 days from symptom onset if you have more severe symptoms. If you quarantine for a shorter period of time (i.e. 5 days), make sure to wear a mask until day 10 of symptoms. Treat your symptoms at home with OTC remedies like tylenol/ibuprofen, mucinex, nyquil, etc. Seek medical attention if you develop high fevers, chest pain, shortness of breath, ear pain, facial pain, etc. Make sure to get up and  move around every 2-3 hours while convalescing to help prevent blood clots. Drink plenty of fluids, and rest as much as possible.     ED Prescriptions    None     PDMP not reviewed this encounter.   , PA-C 12/28/20 1542

## 2020-12-28 NOTE — ED Triage Notes (Signed)
Pt presents with dizziness, bilateral tingling in hands, and central chest tightness since this morning.

## 2020-12-28 NOTE — Discharge Instructions (Addendum)
-  For fevers/chills, body aches, headaches- use Tylenol and Ibuprofen. You can alternate these for maximum effect. Use up to 3000mg  Tylenol daily and 3200mg  Ibuprofen daily. Make sure to take ibuprofen with food. Check the bottle of ibuprofen/tylenol for specific dosage instructions. -Head to ED immediately if you develop left-sided chest pain at rest, left arm pain, dizziness that doesn't go away on its own, tingling or weakness in one arm/leg, etc.   We are currently awaiting result of your PCR covid-19 test. This typically comes back in 1-2 days. We'll call you if the result is positive. Otherwise, the result will be sent electronically to your MyChart. You can also call this clinic and ask for your result via telephone.   Please isolate at home while awaiting these results. If your test is positive for Covid-19, continue to isolate at home for 5 days if you have mild symptoms, or a total of 10 days from symptom onset if you have more severe symptoms. If you quarantine for a shorter period of time (i.e. 5 days), make sure to wear a mask until day 10 of symptoms. Treat your symptoms at home with OTC remedies like tylenol/ibuprofen, mucinex, nyquil, etc. Seek medical attention if you develop high fevers, chest pain, shortness of breath, ear pain, facial pain, etc. Make sure to get up and move around every 2-3 hours while convalescing to help prevent blood clots. Drink plenty of fluids, and rest as much as possible.

## 2020-12-28 NOTE — ED Notes (Signed)
Nasal pharyngeal swab placed in lab, sample is labeled

## 2020-12-28 NOTE — Telephone Encounter (Signed)
Noted, notes from urgent care reviewed.

## 2020-12-31 ENCOUNTER — Other Ambulatory Visit: Payer: Self-pay

## 2020-12-31 ENCOUNTER — Other Ambulatory Visit (INDEPENDENT_AMBULATORY_CARE_PROVIDER_SITE_OTHER): Payer: BC Managed Care – PPO

## 2020-12-31 DIAGNOSIS — E785 Hyperlipidemia, unspecified: Secondary | ICD-10-CM | POA: Diagnosis not present

## 2020-12-31 DIAGNOSIS — I10 Essential (primary) hypertension: Secondary | ICD-10-CM | POA: Diagnosis not present

## 2020-12-31 LAB — COMPREHENSIVE METABOLIC PANEL
ALT: 27 U/L (ref 0–35)
AST: 28 U/L (ref 0–37)
Albumin: 4.6 g/dL (ref 3.5–5.2)
Alkaline Phosphatase: 79 U/L (ref 39–117)
BUN: 16 mg/dL (ref 6–23)
CO2: 28 mEq/L (ref 19–32)
Calcium: 9.8 mg/dL (ref 8.4–10.5)
Chloride: 97 mEq/L (ref 96–112)
Creatinine, Ser: 0.89 mg/dL (ref 0.40–1.20)
GFR: 71.29 mL/min (ref >60.00)
Glucose, Bld: 126 mg/dL — ABNORMAL HIGH (ref 70–99)
Potassium: 3.1 mEq/L — ABNORMAL LOW (ref 3.5–5.1)
Sodium: 138 mEq/L (ref 135–145)
Total Bilirubin: 1.3 mg/dL — ABNORMAL HIGH (ref 0.2–1.2)
Total Protein: 7.8 g/dL (ref 6.0–8.3)

## 2020-12-31 LAB — LIPID PANEL
Cholesterol: 164 mg/dL (ref 0–200)
HDL: 51.8 mg/dL (ref >39.00)
NonHDL: 112.01
Total CHOL/HDL Ratio: 3
Triglycerides: 238 mg/dL — ABNORMAL HIGH (ref 0.0–149.0)
VLDL: 47.6 mg/dL — ABNORMAL HIGH (ref 0.0–40.0)

## 2020-12-31 LAB — LDL CHOLESTEROL, DIRECT: Direct LDL: 90 mg/dL

## 2021-01-03 ENCOUNTER — Telehealth: Payer: Self-pay | Admitting: Primary Care

## 2021-01-03 NOTE — Telephone Encounter (Signed)
Returning your phone call.

## 2021-01-05 NOTE — Telephone Encounter (Signed)
See lab note.  

## 2021-01-07 ENCOUNTER — Other Ambulatory Visit: Payer: Self-pay

## 2021-01-07 ENCOUNTER — Encounter: Payer: BC Managed Care – PPO | Admitting: Primary Care

## 2021-01-07 MED ORDER — LOSARTAN POTASSIUM 50 MG PO TABS
50.0000 mg | ORAL_TABLET | Freq: Every day | ORAL | 0 refills | Status: DC
Start: 2021-01-07 — End: 2021-01-25

## 2021-01-07 MED ORDER — POTASSIUM CHLORIDE CRYS ER 20 MEQ PO TBCR: 20.0000 meq | EXTENDED_RELEASE_TABLET | Freq: Two times a day (BID) | ORAL | 0 refills | Status: DC

## 2021-01-23 ENCOUNTER — Other Ambulatory Visit: Payer: Self-pay | Admitting: Primary Care

## 2021-01-23 DIAGNOSIS — I10 Essential (primary) hypertension: Secondary | ICD-10-CM

## 2021-01-25 ENCOUNTER — Encounter: Payer: Self-pay | Admitting: Primary Care

## 2021-01-25 ENCOUNTER — Other Ambulatory Visit: Payer: Self-pay

## 2021-01-25 ENCOUNTER — Ambulatory Visit (INDEPENDENT_AMBULATORY_CARE_PROVIDER_SITE_OTHER): Payer: BC Managed Care – PPO | Admitting: Primary Care

## 2021-01-25 VITALS — BP 120/68 | HR 83 | Temp 97.9°F | Ht 68.0 in | Wt 224.5 lb

## 2021-01-25 DIAGNOSIS — Z0000 Encounter for general adult medical examination without abnormal findings: Secondary | ICD-10-CM | POA: Diagnosis not present

## 2021-01-25 DIAGNOSIS — R739 Hyperglycemia, unspecified: Secondary | ICD-10-CM | POA: Diagnosis not present

## 2021-01-25 DIAGNOSIS — I10 Essential (primary) hypertension: Secondary | ICD-10-CM | POA: Diagnosis not present

## 2021-01-25 DIAGNOSIS — Z9109 Other allergy status, other than to drugs and biological substances: Secondary | ICD-10-CM

## 2021-01-25 DIAGNOSIS — Z114 Encounter for screening for human immunodeficiency virus [HIV]: Secondary | ICD-10-CM

## 2021-01-25 DIAGNOSIS — R7303 Prediabetes: Secondary | ICD-10-CM | POA: Insufficient documentation

## 2021-01-25 MED ORDER — LOSARTAN POTASSIUM 50 MG PO TABS: 50.0000 mg | ORAL_TABLET | Freq: Every day | ORAL | 3 refills | Status: DC

## 2021-01-25 NOTE — Assessment & Plan Note (Signed)
Noted on fasting labs from February 2022. A1c pending today

## 2021-01-25 NOTE — Assessment & Plan Note (Signed)
Well controlled in the office today on losartan 50 mg. We discontinued HCTZ 25 mg due to hypokalemia, repeat BMP pending today.  Continue losartan 50 mg.

## 2021-01-25 NOTE — Progress Notes (Signed)
Subjective:    Patient ID: Taylor Mullins, female    DOB: October 21, 1961, 60 y.o.   MRN: [id number]  HPI  This visit occurred during the SARS-CoV-2 public health emergency.  Safety protocols were in place, including screening questions prior to the visit, additional usage of staff PPE, and extensive cleaning of exam room while observing appropriate contact time as indicated for disinfecting solutions.   Ms. Taylor Mullins is a 60 year old female who presents today for complete physical.  Immunizations: -Tetanus: 2015 -Influenza: Completed this season  -Shingles: Completed series -Covid-19: Completed three vaccines   Diet: She endorses a poor diet.  Exercise: Active at work, no regular exercise.   Eye exam: No recent exam Dental exam: Completes semi-annually   Pap Smear: Hysterectomy  Mammogram: Completed in July 2021 Colonoscopy: 2019, due 2024 Hep C Screen: Negative  BP Readings from Last 3 Encounters:  01/25/21 120/68  12/28/20 (!) 161/90  01/07/20 124/82     Review of Systems  Constitutional: Negative for unexpected weight change.  HENT: Negative for rhinorrhea.   Eyes: Negative for visual disturbance.  Respiratory: Negative for cough and shortness of breath.   Cardiovascular: Negative for chest pain.  Gastrointestinal: Negative for constipation and diarrhea.  Genitourinary: Negative for difficulty urinating.  Musculoskeletal: Negative for arthralgias and myalgias.  Skin: Negative for rash.  Allergic/Immunologic: Negative for environmental allergies.  Neurological: Negative for dizziness, numbness and headaches.  Psychiatric/Behavioral:       Stress with work, denies depression and anxiety.       Past Medical History:  Diagnosis Date  . Essential hypertension   . Ganglion cyst    right ankle  . Migraine    occasionally   . TMJ dysfunction   . Urinary problem   . Urticaria      Social History   Socioeconomic History  . Marital status: Married    Spouse  name: Not on file  . Number of children: Not on file  . Years of education: Not on file  . Highest education level: Not on file  Occupational History  . Not on file  Tobacco Use  . Smoking status: Never Smoker  . Smokeless tobacco: Never Used  Vaping Use  . Vaping Use: Never used  Substance and Sexual Activity  . Alcohol use: Yes    Comment: rarely  . Drug use: No  . Sexual activity: Not on file  Other Topics Concern  . Not on file  Social History Narrative   Married.   3 children.    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    Social Determinants of Health   Financial Resource Strain: Not on file  Food Insecurity: Not on file  Transportation Needs: Not on file  Physical Activity: Not on file  Stress: Not on file  Social Connections: Not on file  Intimate Partner Violence: Not on file    Past Surgical History:  Procedure Laterality Date  . ABDOMINAL HYSTERECTOMY    . ABLATION    . ANTERIOR AND POSTERIOR REPAIR N/A 06/19/2017   Procedure: ANTERIOR (CYSTOCELE) AND POSTERIOR REPAIR (RECTOCELE);  Surgeon: Alfredo Martinez, MD;  Location: [facility];  Service: Urology;  Laterality: N/A;  . CESAREAN SECTION  [phone]  . CYSTOSCOPY N/A 06/19/2017   Procedure: CYSTOSCOPY;  Surgeon: Alfredo Martinez, MD;  Location: [facility];  Service: Urology;  Laterality: N/A;  . VAGINAL HYSTERECTOMY N/A 06/19/2017   Procedure: HYSTERECTOMY VAGINAL TOTAL;  Surgeon: Malva Limes  E, MD;  Location: [facility];  Service: Gynecology;  Laterality: N/A;    Family History  Problem Relation Age of Onset  . Colon cancer Mother   . Hypertension Mother   . Hyperlipidemia Mother   . Crohn's disease Father   . Heart disease Father     Allergies  Allergen Reactions  . 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 aspirin 81 MG EC tablet Take 81 mg by mouth daily. Takes in the evening    . cetirizine (ZYRTEC) 10 MG tablet Take 10 mg by mouth every evening.     Marland Kitchen EPINEPHrine 0.3 mg/0.3 mL IJ SOAJ injection See admin instructions.    . metoprolol tartrate (LOPRESSOR) 25 MG tablet TAKE 1 TABLET BY MOUTH TWICE A DAY 180 tablet 1  . simvastatin (ZOCOR) 20 MG tablet TAKE 1 TABLET (20 MG TOTAL) BY MOUTH DAILY. FOR CHOLESTEROL. 90 tablet 0  . UNABLE TO FIND Gets weekly shots for allergies     No current facility-administered medications on file prior to visit.    BP 120/68   Pulse 83   Temp 97.9 F (36.6 C) (Temporal)   Ht 5\' 8"  (1.727 m)   Wt 224 lb 8 oz (101.8 kg)   SpO2 99%   BMI 34.14 kg/m    Objective:   Physical Exam Constitutional:      Appearance: She is well-nourished.  HENT:     Right Ear: Tympanic membrane and ear canal normal.     Left Ear: Tympanic membrane and ear canal normal.     Mouth/Throat:     Mouth: Oropharynx is clear and moist.  Eyes:     Extraocular Movements: EOM normal.     Pupils: Pupils are equal, round, and reactive to light.  Cardiovascular:     Rate and Rhythm: Normal rate and regular rhythm.  Pulmonary:     Effort: Pulmonary effort is normal.     Breath sounds: Normal breath sounds.  Abdominal:     General: Bowel sounds are normal.     Palpations: Abdomen is soft.     Tenderness: There is no abdominal tenderness.  Musculoskeletal:        General: Normal range of motion.     Cervical back: Neck supple.  Skin:    General: Skin is warm and dry.  Neurological:     Mental Status: She is alert and oriented to person, place, and time.     Cranial Nerves: No cranial nerve deficit.     Deep Tendon Reflexes:     Reflex Scores:      Patellar reflexes are 2+ on the right side and 2+ on the left side. Psychiatric:        Mood and Affect: Mood and affect and mood normal.            Assessment & Plan:

## 2021-01-25 NOTE — Patient Instructions (Signed)
Stop by the lab prior to leaving today. I will notify you of your results once received.   Continue losartan 50 mg once daily for blood pressure.   Start exercising. You should be getting 150 minutes of moderate intensity exercise weekly.  It's important to improve your diet by reducing consumption of fast food, fried food, processed snack foods, sugary drinks. Increase consumption of fresh vegetables and fruits, whole grains, water.  Ensure you are drinking 64 ounces of water daily.  It was a pleasure to see you today!   Preventive Care 89-63 Years Old, Female Preventive care refers to lifestyle choices and visits with your health care provider that can promote health and wellness. This includes:  A yearly physical exam. This is also called an annual wellness visit.  Regular dental and eye exams.  Immunizations.  Screening for certain conditions.  Healthy lifestyle choices, such as: ? Eating a healthy diet. ? Getting regular exercise. ? Not using drugs or products that contain nicotine and tobacco. ? Limiting alcohol use. What can I expect for my preventive care visit? Physical exam Your health care provider will check your:  Height and weight. These may be used to calculate your BMI (body mass index). BMI is a measurement that tells if you are at a healthy weight.  Heart rate and blood pressure.  Body temperature.  Skin for abnormal spots. Counseling Your health care provider may ask you questions about your:  Past medical problems.  Family's medical history.  Alcohol, tobacco, and drug use.  Emotional well-being.  Home life and relationship well-being.  Sexual activity.  Diet, exercise, and sleep habits.  Work and work Statistician.  Access to firearms.  Method of birth control.  Menstrual cycle.  Pregnancy history. What immunizations do I need? Vaccines are usually given at various ages, according to a schedule. Your health care provider will  recommend vaccines for you based on your age, medical history, and lifestyle or other factors, such as travel or where you work.   What tests do I need? Blood tests  Lipid and cholesterol levels. These may be checked every 5 years, or more often if you are over 54 years old.  Hepatitis C test.  Hepatitis B test. Screening  Lung cancer screening. You may have this screening every year starting at age 76 if you have a 30-pack-year history of smoking and currently smoke or have quit within the past 15 years.  Colorectal cancer screening. ? All adults should have this screening starting at age 56 and continuing until age 70. ? Your health care provider may recommend screening at age 31 if you are at increased risk. ? You will have tests every 1-10 years, depending on your results and the type of screening test.  Diabetes screening. ? This is done by checking your blood sugar (glucose) after you have not eaten for a while (fasting). ? You may have this done every 1-3 years.  Mammogram. ? This may be done every 1-2 years. ? Talk with your health care provider about when you should start having regular mammograms. This may depend on whether you have a family history of breast cancer.  BRCA-related cancer screening. This may be done if you have a family history of breast, ovarian, tubal, or peritoneal cancers.  Pelvic exam and Pap test. ? This may be done every 3 years starting at age 66. ? Starting at age 81, this may be done every 5 years if you have a Pap test in  combination with an HPV test. Other tests  STD (sexually transmitted disease) testing, if you are at risk.  Bone density scan. This is done to screen for osteoporosis. You may have this scan if you are at high risk for osteoporosis. Talk with your health care provider about your test results, treatment options, and if necessary, the need for more tests. Follow these instructions at home: Eating and drinking  Eat a diet  that includes fresh fruits and vegetables, whole grains, lean protein, and low-fat dairy products.  Take vitamin and mineral supplements as recommended by your health care provider.  Do not drink alcohol if: ? Your health care provider tells you not to drink. ? You are pregnant, may be pregnant, or are planning to become pregnant.  If you drink alcohol: ? Limit how much you have to 0-1 drink a day. ? Be aware of how much alcohol is in your drink. In the [place] one drink equals one 12 oz bottle of beer (355 mL), one 5 oz glass of wine (148 mL), or one 1 oz glass of hard liquor (44 mL).   Lifestyle  Take daily care of your teeth and gums. Brush your teeth every morning and night with fluoride toothpaste. Floss one time each day.  Stay active. Exercise for at least 30 minutes 5 or more days each week.  Do not use any products that contain nicotine or tobacco, such as cigarettes, e-cigarettes, and chewing tobacco. If you need help quitting, ask your health care provider.  Do not use drugs.  If you are sexually active, practice safe sex. Use a condom or other form of protection to prevent STIs (sexually transmitted infections).  If you do not wish to become pregnant, use a form of birth control. If you plan to become pregnant, see your health care provider for a prepregnancy visit.  If told by your health care provider, take low-dose aspirin daily starting at age 67.  Find healthy ways to cope with stress, such as: ? Meditation, yoga, or listening to music. ? Journaling. ? Talking to a trusted person. ? Spending time with friends and family. Safety  Always wear your seat belt while driving or riding in a vehicle.  Do not drive: ? If you have been drinking alcohol. Do not ride with someone who has been drinking. ? When you are tired or distracted. ? While texting.  Wear a helmet and other protective equipment during sports activities.  If you have firearms in your house, make  sure you follow all gun safety procedures. What's next?  Visit your health care provider once a year for an annual wellness visit.  Ask your health care provider how often you should have your eyes and teeth checked.  Stay up to date on all vaccines. This information is not intended to replace advice given to you by your health care provider. Make sure you discuss any questions you have with your health care provider. Document Revised: 08/10/2020 Document Reviewed: 07/18/2018 Elsevier Patient Education  2021 [employer].

## 2021-01-25 NOTE — Assessment & Plan Note (Addendum)
Follows with allergist, completes weekly allergy injections.  Continue same.

## 2021-01-25 NOTE — Assessment & Plan Note (Signed)
Immunizations up-to-date. Mammogram up-to-date, completes these at GYN office. Colonoscopy up-to-date, due in 2024.  Discussed the importance of a healthy diet and regular exercise in order for weight loss, and to reduce the risk of any potential medical problems.   Exam today stable. Labs reviewed and also pending.

## 2021-01-26 LAB — BASIC METABOLIC PANEL
BUN: 16 mg/dL (ref 6–23)
CO2: 27 mEq/L (ref 19–32)
Calcium: 9.8 mg/dL (ref 8.4–10.5)
Chloride: 105 mEq/L (ref 96–112)
Creatinine, Ser: 0.76 mg/dL (ref 0.40–1.20)
GFR: 86.12 mL/min (ref >60.00)
Glucose, Bld: 109 mg/dL — ABNORMAL HIGH (ref 70–99)
Potassium: 4 mEq/L (ref 3.5–5.1)
Sodium: 140 mEq/L (ref 135–145)

## 2021-01-26 LAB — HIV ANTIBODY (ROUTINE TESTING W REFLEX): HIV 1&2 Ab, 4th Generation: NONREACTIVE

## 2021-01-26 LAB — HEMOGLOBIN A1C: Hgb A1c MFr Bld: 5.6 % (ref 4.6–6.5)

## 2021-03-15 ENCOUNTER — Other Ambulatory Visit: Payer: Self-pay | Admitting: Primary Care

## 2021-03-15 DIAGNOSIS — I10 Essential (primary) hypertension: Secondary | ICD-10-CM

## 2021-03-25 ENCOUNTER — Other Ambulatory Visit: Payer: Self-pay | Admitting: Primary Care

## 2021-03-25 DIAGNOSIS — E785 Hyperlipidemia, unspecified: Secondary | ICD-10-CM

## 2021-12-21 ENCOUNTER — Other Ambulatory Visit: Payer: Self-pay | Admitting: Primary Care

## 2021-12-21 DIAGNOSIS — E785 Hyperlipidemia, unspecified: Secondary | ICD-10-CM

## 2022-01-26 ENCOUNTER — Encounter: Payer: BC Managed Care – PPO | Admitting: Primary Care

## 2022-01-27 ENCOUNTER — Other Ambulatory Visit: Payer: Self-pay | Admitting: Primary Care

## 2022-01-27 DIAGNOSIS — I10 Essential (primary) hypertension: Secondary | ICD-10-CM

## 2022-02-01 ENCOUNTER — Encounter: Payer: Self-pay | Admitting: Primary Care

## 2022-02-01 ENCOUNTER — Ambulatory Visit (INDEPENDENT_AMBULATORY_CARE_PROVIDER_SITE_OTHER): Payer: BC Managed Care – PPO | Admitting: Primary Care

## 2022-02-01 ENCOUNTER — Other Ambulatory Visit: Payer: Self-pay

## 2022-02-01 VITALS — BP 114/74 | HR 69 | Ht 69.0 in | Wt 232.8 lb

## 2022-02-01 DIAGNOSIS — E785 Hyperlipidemia, unspecified: Secondary | ICD-10-CM

## 2022-02-01 DIAGNOSIS — R739 Hyperglycemia, unspecified: Secondary | ICD-10-CM | POA: Diagnosis not present

## 2022-02-01 DIAGNOSIS — Z0000 Encounter for general adult medical examination without abnormal findings: Secondary | ICD-10-CM | POA: Diagnosis not present

## 2022-02-01 DIAGNOSIS — I10 Essential (primary) hypertension: Secondary | ICD-10-CM | POA: Diagnosis not present

## 2022-02-01 DIAGNOSIS — Z9109 Other allergy status, other than to drugs and biological substances: Secondary | ICD-10-CM

## 2022-02-01 LAB — CBC
HCT: 42.6 % (ref 36.0–46.0)
Hemoglobin: 14 g/dL (ref 12.0–15.0)
MCHC: 32.8 g/dL (ref 30.0–36.0)
MCV: 91.4 fl (ref 78.0–100.0)
Platelets: 246 10*3/uL (ref 150.0–400.0)
RBC: 4.66 Mil/uL (ref 3.87–5.11)
RDW: 13.7 % (ref 11.5–15.5)
WBC: 6.5 10*3/uL (ref 4.0–10.5)

## 2022-02-01 LAB — COMPREHENSIVE METABOLIC PANEL
ALT: 47 U/L — ABNORMAL HIGH (ref 0–35)
AST: 36 U/L (ref 0–37)
Albumin: 4.6 g/dL (ref 3.5–5.2)
Alkaline Phosphatase: 88 U/L (ref 39–117)
BUN: 17 mg/dL (ref 6–23)
CO2: 24 mEq/L (ref 19–32)
Calcium: 9.3 mg/dL (ref 8.4–10.5)
Chloride: 106 mEq/L (ref 96–112)
Creatinine, Ser: 0.79 mg/dL (ref 0.40–1.20)
GFR: 81.63 mL/min (ref >60.00)
Glucose, Bld: 109 mg/dL — ABNORMAL HIGH (ref 70–99)
Potassium: 4 mEq/L (ref 3.5–5.1)
Sodium: 139 mEq/L (ref 135–145)
Total Bilirubin: 1 mg/dL (ref 0.2–1.2)
Total Protein: 6.8 g/dL (ref 6.0–8.3)

## 2022-02-01 LAB — LIPID PANEL
Cholesterol: 118 mg/dL (ref 0–200)
HDL: 43.9 mg/dL (ref >39.00)
LDL Cholesterol: 48 mg/dL (ref 0–99)
NonHDL: 73.79
Total CHOL/HDL Ratio: 3
Triglycerides: 131 mg/dL (ref 0.0–149.0)
VLDL: 26.2 mg/dL (ref 0.0–40.0)

## 2022-02-01 LAB — POCT GLYCOSYLATED HEMOGLOBIN (HGB A1C): Hemoglobin A1C: 5.5 % (ref 4.0–5.6)

## 2022-02-01 NOTE — Assessment & Plan Note (Signed)
Controlled.  ? ?Continue metoprolol tartrate 25 mg BID, losartan 50 mg daily.  ? ?CMP pending.  ?

## 2022-02-01 NOTE — Progress Notes (Signed)
? ?Subjective:  ? ? Patient ID: Taylor Mullins, female    DOB: 02-02-1961, 61 y.o.   MRN: [id number] ? ?HPI ? ?Taylor Mullins is a very pleasant 61 y.o. female who presents today for complete physical and follow up of chronic conditions. ? ?Immunizations: ?-Tetanus: 2015 ?-Influenza: Completed this season ?-Covid-19: 3 vaccines ?-Shingles: Needed Shingrix ? ?Diet: Fair diet.  ?Exercise: No regular exercise. ? ?Eye exam: Completes annually  ?Dental exam: Completes semi-annually  ? ?Pap Smear: Hysterectomy ?Mammogram: Completed in January 2023 ?Colonoscopy: Completed in 2019, due 2024 ? ?BP Readings from Last 3 Encounters:  ?02/01/22 114/74  ?01/25/21 120/68  ?12/28/20 (!) 161/90  ? ? ? ? ? ?Review of Systems  ?Constitutional:  Negative for unexpected weight change.  ?HENT:  Negative for rhinorrhea.   ?Eyes:  Negative for visual disturbance.  ?Respiratory:  Negative for cough and shortness of breath.   ?Cardiovascular:  Negative for chest pain.  ?Gastrointestinal:  Negative for constipation and diarrhea.  ?Genitourinary:  Negative for difficulty urinating.  ?Musculoskeletal:  Negative for arthralgias and myalgias.  ?Skin:  Negative for rash.  ?Allergic/Immunologic: Positive for environmental allergies.  ?Neurological:  Negative for dizziness and headaches.  ?Psychiatric/Behavioral:  The patient is not nervous/anxious.   ? ?   ? ? ?Past Medical History:  ?Diagnosis Date  ? Essential hypertension   ? Ganglion cyst   ? right ankle  ? Migraine   ? occasionally   ? TMJ dysfunction   ? Urinary problem   ? Urticaria   ? ? ?Social History  ? ?Socioeconomic History  ? Marital status: Married  ?  Spouse name: Not on file  ? Number of children: Not on file  ? Years of education: Not on file  ? Highest education level: Not on file  ?Occupational History  ? Not on file  ?Tobacco Use  ? Smoking status: Never  ? Smokeless tobacco: Never  ?Vaping Use  ? Vaping Use: Never used  ?Substance and Sexual Activity  ? Alcohol use: Yes  ?   Comment: rarely  ? Drug use: No  ? Sexual activity: Not on file  ?Other Topics Concern  ? Not on file  ?Social History Narrative  ? Married.  ? 3 children.   ? Works as a Runner, broadcasting/film/video at [employer].  ? 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   ? ?Social Determinants of Health  ? ?Financial Resource Strain: Not on file  ?Food Insecurity: Not on file  ?Transportation Needs: Not on file  ?Physical Activity: Not on file  ?Stress: Not on file  ?Social Connections: Not on file  ?Intimate Partner Violence: Not on file  ? ? ?Past Surgical History:  ?Procedure Laterality Date  ? ABDOMINAL HYSTERECTOMY    ? ABLATION    ? ANTERIOR AND POSTERIOR REPAIR N/A 06/19/2017  ? Procedure: ANTERIOR (CYSTOCELE) AND POSTERIOR REPAIR (RECTOCELE);  Surgeon: Alfredo Martinez, MD;  Location: [facility];  Service: Urology;  Laterality: N/A;  ? CESAREAN SECTION  [phone]  ? CYSTOSCOPY N/A 06/19/2017  ? Procedure: CYSTOSCOPY;  Surgeon: Alfredo Martinez, MD;  Location: [facility];  Service: Urology;  Laterality: N/A;  ? VAGINAL HYSTERECTOMY N/A 06/19/2017  ? Procedure: HYSTERECTOMY VAGINAL TOTAL;  Surgeon: Levi Aland, MD;  Location: [facility];  Service: Gynecology;  Laterality: N/A;  ? ? ?Family History  ?Problem Relation Age of Onset  ? Colon cancer Mother   ? Hypertension Mother   ? Hyperlipidemia Mother   ? Crohn's disease Father   ?  Heart disease Father   ? ? ?Allergies  ?Allergen Reactions  ? 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  Lips swell and mouth; only occurs with shrimp and millusk ; able to eat crabs and lobster with no issue   ? ? ?Current Outpatient Medications on File Prior to Visit  ?Medication Sig Dispense Refill  ? aspirin 81 MG EC tablet Take 81 mg by mouth daily. Takes in the evening    ? cetirizine (ZYRTEC) 10 MG tablet Take 10 mg by mouth every evening.     ? estradiol (ESTRACE) 0.1 MG/GM vaginal cream Place vaginally.    ? losartan (COZAAR) 50 MG tablet Take 1 tablet (50 mg total) by mouth daily. For blood pressure. 90  tablet 3  ? metoprolol tartrate (LOPRESSOR) 25 MG tablet TAKE 1 TABLET BY MOUTH TWICE A DAY 180 tablet 3  ? simvastatin (ZOCOR) 20 MG tablet Take 1 tablet (20 mg total) by mouth daily. For cholesterol. Office visit required for further refills. 90 tablet 0  ? UNABLE TO FIND Gets weekly shots for allergies    ? ?No current facility-administered medications on file prior to visit.  ? ? ?BP 114/74   Pulse 69   Ht 5\' 9"  (1.753 m)   Wt 232 lb 12.8 oz (105.6 kg)   SpO2 97%   BMI 34.38 kg/m?  ?Objective:  ? Physical Exam ?HENT:  ?   Right Ear: Tympanic membrane and ear canal normal.  ?   Left Ear: Tympanic membrane and ear canal normal.  ?   Nose: Nose normal.  ?Eyes:  ?   Conjunctiva/sclera: Conjunctivae normal.  ?   Pupils: Pupils are equal, round, and reactive to light.  ?Neck:  ?   Thyroid: No thyromegaly.  ?Cardiovascular:  ?   Rate and Rhythm: Normal rate and regular rhythm.  ?   Heart sounds: No murmur heard. ?Pulmonary:  ?   Effort: Pulmonary effort is normal.  ?   Breath sounds: Normal breath sounds. No rales.  ?Abdominal:  ?   General: Bowel sounds are normal.  ?   Palpations: Abdomen is soft.  ?   Tenderness: There is no abdominal tenderness.  ?Musculoskeletal:     ?   General: Normal range of motion.  ?   Cervical back: Neck supple.  ?Lymphadenopathy:  ?   Cervical: No cervical adenopathy.  ?Skin: ?   General: Skin is warm and dry.  ?   Findings: No rash.  ?Neurological:  ?   Mental Status: She is alert and oriented to person, place, and time.  ?   Cranial Nerves: No cranial nerve deficit.  ?   Deep Tendon Reflexes: Reflexes are normal and symmetric.  ?Psychiatric:     ?   Mood and Affect: Mood normal.  ? ? ? ? ? ?   ?Assessment & Plan:  ? ? ? ? ?This visit occurred during the SARS-CoV-2 public health emergency.  Safety protocols were in place, including screening questions prior to the visit, additional usage of staff PPE, and extensive cleaning of exam room while observing appropriate contact time as  indicated for disinfecting solutions.  ?

## 2022-02-01 NOTE — Assessment & Plan Note (Signed)
Continue simvastatin 20 mg daily. Repeat lipid panel pending. 

## 2022-02-01 NOTE — Assessment & Plan Note (Signed)
Immunizations UTD. ?Colonoscopy UTD, due 2024. ?Mammogram UTD, completes at [facility].  ? ?Discussed the importance of a healthy diet and regular exercise in order for weight loss, and to reduce the risk of further co-morbidity. ? ?Exam today stable ?Labs pending ?

## 2022-02-01 NOTE — Patient Instructions (Signed)
Stop by the lab prior to leaving today. I will notify you of your results once received.  ? ?It was a pleasure to see you today! ? ?Preventive Care 40-60 Years Old, Female ?Preventive care refers to lifestyle choices and visits with your health care provider that can promote health and wellness. Preventive care visits are also called wellness exams. ?What can I expect for my preventive care visit? ?Counseling ?Your health care provider may ask you questions about your: ?Medical history, including: ?Past medical problems. ?Family medical history. ?Pregnancy history. ?Current health, including: ?Menstrual cycle. ?Method of birth control. ?Emotional well-being. ?Home life and relationship well-being. ?Sexual activity and sexual health. ?Lifestyle, including: ?Alcohol, nicotine or tobacco, and drug use. ?Access to firearms. ?Diet, exercise, and sleep habits. ?Work and work environment. ?Sunscreen use. ?Safety issues such as seatbelt and bike helmet use. ?Physical exam ?Your health care provider will check your: ?Height and weight. These may be used to calculate your BMI (body mass index). BMI is a measurement that tells if you are at a healthy weight. ?Waist circumference. This measures the distance around your waistline. This measurement also tells if you are at a healthy weight and may help predict your risk of certain diseases, such as type 2 diabetes and high blood pressure. ?Heart rate and blood pressure. ?Body temperature. ?Skin for abnormal spots. ?What immunizations do I need? ?Vaccines are usually given at various ages, according to a schedule. Your health care provider will recommend vaccines for you based on your age, medical history, and lifestyle or other factors, such as travel or where you work. ?What tests do I need? ?Screening ?Your health care provider may recommend screening tests for certain conditions. This may include: ?Lipid and cholesterol levels. ?Diabetes screening. This is done by checking  your blood sugar (glucose) after you have not eaten for a while (fasting). ?Pelvic exam and Pap test. ?Hepatitis B test. ?Hepatitis C test. ?HIV (human immunodeficiency virus) test. ?STI (sexually transmitted infection) testing, if you are at risk. ?Lung cancer screening. ?Colorectal cancer screening. ?Mammogram. Talk with your health care provider about when you should start having regular mammograms. This may depend on whether you have a family history of breast cancer. ?BRCA-related cancer screening. This may be done if you have a family history of breast, ovarian, tubal, or peritoneal cancers. ?Bone density scan. This is done to screen for osteoporosis. ?Talk with your health care provider about your test results, treatment options, and if necessary, the need for more tests. ?Follow these instructions at home: ?Eating and drinking ? ?Eat a diet that includes fresh fruits and vegetables, whole grains, lean protein, and low-fat dairy products. ?Take vitamin and mineral supplements as recommended by your health care provider. ?Do not drink alcohol if: ?Your health care provider tells you not to drink. ?You are pregnant, may be pregnant, or are planning to become pregnant. ?If you drink alcohol: ?Limit how much you have to 0-1 drink a day. ?Know how much alcohol is in your drink. In the [place] one drink equals one 12 oz bottle of beer (355 mL), one 5 oz glass of wine (148 mL), or one 1? oz glass of hard liquor (44 mL). ?Lifestyle ?Brush your teeth every morning and night with fluoride toothpaste. Floss one time each day. ?Exercise for at least 30 minutes 5 or more days each week. ?Do not use any products that contain nicotine or tobacco. These products include cigarettes, chewing tobacco, and vaping devices, such as e-cigarettes. If you need help   quitting, ask your health care provider. ?Do not use drugs. ?If you are sexually active, practice safe sex. Use a condom or other form of protection to prevent STIs. ?If you  do not wish to become pregnant, use a form of birth control. If you plan to become pregnant, see your health care provider for a prepregnancy visit. ?Take aspirin only as told by your health care provider. Make sure that you understand how much to take and what form to take. Work with your health care provider to find out whether it is safe and beneficial for you to take aspirin daily. ?Find healthy ways to manage stress, such as: ?Meditation, yoga, or listening to music. ?Journaling. ?Talking to a trusted person. ?Spending time with friends and family. ?Minimize exposure to UV radiation to reduce your risk of skin cancer. ?Safety ?Always wear your seat belt while driving or riding in a vehicle. ?Do not drive: ?If you have been drinking alcohol. Do not ride with someone who has been drinking. ?When you are tired or distracted. ?While texting. ?If you have been using any mind-altering substances or drugs. ?Wear a helmet and other protective equipment during sports activities. ?If you have firearms in your house, make sure you follow all gun safety procedures. ?Seek help if you have been physically or sexually abused. ?What's next? ?Visit your health care provider once a year for an annual wellness visit. ?Ask your health care provider how often you should have your eyes and teeth checked. ?Stay up to date on all vaccines. ?This information is not intended to replace advice given to you by your health care provider. Make sure you discuss any questions you have with your health care provider. ?Document Revised: 05/04/2021 Document Reviewed: 05/04/2021 ?Elsevier Patient Education ? 2022 [employer] ? ?

## 2022-02-01 NOTE — Assessment & Plan Note (Signed)
A1C within normal range. ?Continue to monitor.  ?

## 2022-02-01 NOTE — Assessment & Plan Note (Signed)
Following with allergy. ? ?Continue allergy injections weekly. ?Continue Zyrtec 10 mg daily.  ?

## 2022-03-06 ENCOUNTER — Other Ambulatory Visit: Payer: Self-pay | Admitting: Primary Care

## 2022-03-06 DIAGNOSIS — I10 Essential (primary) hypertension: Secondary | ICD-10-CM

## 2022-03-17 ENCOUNTER — Other Ambulatory Visit: Payer: Self-pay | Admitting: Primary Care

## 2022-03-17 DIAGNOSIS — E785 Hyperlipidemia, unspecified: Secondary | ICD-10-CM

## 2022-06-18 ENCOUNTER — Other Ambulatory Visit: Payer: Self-pay | Admitting: Nurse Practitioner

## 2022-06-18 DIAGNOSIS — E785 Hyperlipidemia, unspecified: Secondary | ICD-10-CM

## 2022-06-27 ENCOUNTER — Encounter: Payer: Self-pay | Admitting: Family Medicine

## 2022-06-27 ENCOUNTER — Ambulatory Visit: Payer: BC Managed Care – PPO | Admitting: Family Medicine

## 2022-06-27 DIAGNOSIS — H9201 Otalgia, right ear: Secondary | ICD-10-CM | POA: Insufficient documentation

## 2022-06-27 MED ORDER — FLUTICASONE PROPIONATE 50 MCG/ACT NA SUSP: 2.0000 | Freq: Every day | NASAL | 1 refills | Status: DC

## 2022-06-27 NOTE — Progress Notes (Signed)
Subjective:    Patient ID: Taylor Mullins, female    DOB: February 25, 1961, 61 y.o.   MRN: [id number]  HPI 61 yo pt of NP Clark presents with ear pain   Wt Readings from Last 3 Encounters:  06/27/22 240 lb (108.9 kg)  02/01/22 232 lb 12.8 oz (105.6 kg)  01/25/21 224 lb 8 oz (101.8 kg)   35.44 kg/m   1 week- ear fullness and pain  Hurts on that side of throat also  No drainage  No change in hearing  No external pain   No fever No headache No new nasal symptoms   Baseline allergies  Takes zyrtec every night  Weekly immunotherapy  No nasal sprays    Otc  Advil - wears off after 3-4 hours/ does help   Patient Active Problem List   Diagnosis Date Noted   Otalgia, right 06/27/2022   Hyperlipidemia 02/01/2022   Hyperglycemia 01/25/2021   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/22/2019   Cystocele, midline 06/19/2017   Preventative health care 12/19/2016   Essential hypertension 09/18/2016   Recurrent UTI 09/18/2016   Past Medical History:  Diagnosis Date   Essential hypertension    Ganglion cyst    right ankle   Migraine    occasionally    TMJ dysfunction    Urinary problem    Urticaria    Past Surgical History:  Procedure Laterality Date   ABDOMINAL HYSTERECTOMY     ABLATION     ANTERIOR AND POSTERIOR REPAIR N/A 06/19/2017   Procedure: ANTERIOR (CYSTOCELE) AND POSTERIOR REPAIR (RECTOCELE);  Surgeon: Bjorn Loser, MD;  Location: [facility];  Service: Urology;  Laterality: N/A;   CESAREAN SECTION  89,92   CYSTOSCOPY N/A 06/19/2017   Procedure: CYSTOSCOPY;  Surgeon: Bjorn Loser, MD;  Location: [facility];  Service: Urology;  Laterality: N/A;   VAGINAL HYSTERECTOMY N/A 06/19/2017   Procedure: HYSTERECTOMY VAGINAL TOTAL;  Surgeon: Olga Millers, MD;  Location: [facility];  Service: Gynecology;  Laterality: N/A;   Social History   Tobacco Use   Smoking status: Never   Smokeless tobacco: Never  Vaping Use   Vaping Use: Never used  Substance Use Topics   Alcohol  use: Yes    Comment: rarely   Drug use: No   Family History  Problem Relation Age of Onset   Colon cancer Mother    Hypertension Mother    Hyperlipidemia Mother    Crohn's disease Father    Heart disease Father    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 Takes in the evening     cetirizine (ZYRTEC) 10 MG tablet Take 10 mg by mouth every evening.      estradiol (ESTRACE) 0.1 MG/GM vaginal cream Place vaginally.     losartan (COZAAR) 50 MG tablet TAKE 1 TABLET (50 MG TOTAL) BY MOUTH DAILY. FOR BLOOD PRESSURE. 90 tablet 3   metoprolol tartrate (LOPRESSOR) 25 MG tablet Take 1 tablet (25 mg total) by mouth 2 (two) times daily. for blood pressure. 180 tablet 3   simvastatin (ZOCOR) 20 MG tablet Take 1 tablet (20 mg total) by mouth daily. for cholesterol. 90 tablet 1   UNABLE TO FIND  Gets weekly shots for allergies     No current facility-administered medications on file prior to visit.    Review of Systems  Constitutional:  Negative for activity change, appetite change, fatigue, fever and unexpected weight change.  HENT:  Positive for ear pain, rhinorrhea and sore throat. Negative for congestion, ear discharge, facial swelling, hearing loss, postnasal drip and sinus pressure.   Eyes:  Negative for pain, redness and visual disturbance.  Respiratory:  Negative for cough, shortness of breath and wheezing.   Cardiovascular:  Negative for chest pain and palpitations.  Gastrointestinal:  Negative for abdominal pain, blood in stool, constipation and diarrhea.  Endocrine: Negative for polydipsia and polyuria.  Genitourinary:  Negative for dysuria, frequency and urgency.  Musculoskeletal:  Negative for arthralgias, back  pain and myalgias.  Skin:  Negative for pallor and rash.  Allergic/Immunologic: Negative for environmental allergies.  Neurological:  Negative for dizziness, syncope and headaches.  Hematological:  Negative for adenopathy. Does not bruise/bleed easily.  Psychiatric/Behavioral:  Negative for decreased concentration and dysphoric mood. The patient is not nervous/anxious.        Objective:   Physical Exam Constitutional:      General: She is not in acute distress.    Appearance: Normal appearance. She is obese. She is not ill-appearing or diaphoretic.  HENT:     Head: Normocephalic and atraumatic.     Comments: No sinus, temporal or ear tenderness     Left Ear: Tympanic membrane, ear canal and external ear normal. There is no impacted cerumen.     Ears:     Comments: R TM is retracted and dull  No erythema  No bulging     Nose: Rhinorrhea present. No congestion.     Comments: Injected boggy nares      Mouth/Throat:     Mouth: Mucous membranes are moist.     Pharynx: Oropharynx is clear. No oropharyngeal exudate or posterior oropharyngeal erythema.  Eyes:     General:        Right eye: No discharge.        Left eye: No discharge.     Conjunctiva/sclera: Conjunctivae normal.     Pupils: Pupils are equal, round, and reactive to light.  Neck:     Vascular: No carotid bruit.  Cardiovascular:     Rate and Rhythm: Normal rate and regular rhythm.     Heart sounds: Normal heart sounds.  Pulmonary:     Effort: Pulmonary effort is normal. No respiratory distress.     Breath sounds: Normal breath sounds. No wheezing or rales.  Musculoskeletal:     Cervical back: Normal range of motion and neck supple. No tenderness.  Lymphadenopathy:     Cervical: No cervical adenopathy.  Skin:    General: Skin is warm.     Findings: No erythema or rash.  Neurological:     Mental Status: She is alert.     Cranial Nerves: No cranial nerve deficit.  Psychiatric:        Mood and Affect: Mood  normal.           Assessment & Plan:   Problem List Items Addressed This Visit       Other   Otalgia, right    Retracted TM on exam with dullness (no erythema or bulging) Suspect ETD Handout given  inst to continue zyrtec  Px flonase to use daily for about a month  inst to update if not improving in 1-2 weeks or if worse/pain  or new symptoms like fever or dizziness Update if not starting to improve in a week or if worsening

## 2022-06-27 NOTE — Patient Instructions (Signed)
Use fluticasone nasal spray 2 sprays in each nostril once daily   It will take 1-2 weeks to start working , please let us know if not helpful   Advil is ok for now   Continue the zyrtec   Watch for worse ear pain, facial pain, fever or any other symptoms   Update if not starting to improve in 1-2 weeks or if worsening

## 2022-06-27 NOTE — Assessment & Plan Note (Signed)
Retracted TM on exam with dullness (no erythema or bulging) Suspect ETD Handout given  inst to continue zyrtec  Px flonase to use daily for about a month  inst to update if not improving in 1-2 weeks or if worse/pain or new symptoms like fever or dizziness Update if not starting to improve in a week or if worsening

## 2022-07-19 ENCOUNTER — Other Ambulatory Visit: Payer: Self-pay | Admitting: Family Medicine

## 2022-12-12 LAB — HM MAMMOGRAPHY

## 2022-12-16 ENCOUNTER — Other Ambulatory Visit: Payer: Self-pay | Admitting: Primary Care

## 2022-12-16 DIAGNOSIS — E785 Hyperlipidemia, unspecified: Secondary | ICD-10-CM

## 2022-12-21 LAB — HM PAP SMEAR

## 2023-01-23 ENCOUNTER — Other Ambulatory Visit: Payer: Self-pay | Admitting: Primary Care

## 2023-01-23 DIAGNOSIS — I10 Essential (primary) hypertension: Secondary | ICD-10-CM

## 2023-02-06 ENCOUNTER — Encounter: Payer: BC Managed Care – PPO | Admitting: Primary Care

## 2023-02-21 ENCOUNTER — Other Ambulatory Visit: Payer: Self-pay | Admitting: Primary Care

## 2023-02-21 DIAGNOSIS — I10 Essential (primary) hypertension: Secondary | ICD-10-CM

## 2023-02-22 LAB — HM COLONOSCOPY

## 2023-02-28 ENCOUNTER — Ambulatory Visit (INDEPENDENT_AMBULATORY_CARE_PROVIDER_SITE_OTHER): Payer: BC Managed Care – PPO | Admitting: Primary Care

## 2023-02-28 ENCOUNTER — Encounter: Payer: Self-pay | Admitting: Primary Care

## 2023-02-28 VITALS — BP 112/76 | HR 73 | Temp 97.2°F | Ht 69.0 in | Wt 237.0 lb

## 2023-02-28 DIAGNOSIS — I10 Essential (primary) hypertension: Secondary | ICD-10-CM | POA: Diagnosis not present

## 2023-02-28 DIAGNOSIS — Z9109 Other allergy status, other than to drugs and biological substances: Secondary | ICD-10-CM | POA: Diagnosis not present

## 2023-02-28 DIAGNOSIS — E785 Hyperlipidemia, unspecified: Secondary | ICD-10-CM | POA: Diagnosis not present

## 2023-02-28 DIAGNOSIS — Z0000 Encounter for general adult medical examination without abnormal findings: Secondary | ICD-10-CM

## 2023-02-28 LAB — COMPREHENSIVE METABOLIC PANEL
ALT: 28 U/L (ref 0–35)
AST: 26 U/L (ref 0–37)
Albumin: 4.5 g/dL (ref 3.5–5.2)
Alkaline Phosphatase: 78 U/L (ref 39–117)
BUN: 14 mg/dL (ref 6–23)
CO2: 25 mEq/L (ref 19–32)
Calcium: 9.6 mg/dL (ref 8.4–10.5)
Chloride: 104 mEq/L (ref 96–112)
Creatinine, Ser: 0.79 mg/dL (ref 0.40–1.20)
GFR: 81.01 mL/min (ref >60.00)
Glucose, Bld: 112 mg/dL — ABNORMAL HIGH (ref 70–99)
Potassium: 4 mEq/L (ref 3.5–5.1)
Sodium: 140 mEq/L (ref 135–145)
Total Bilirubin: 0.9 mg/dL (ref 0.2–1.2)
Total Protein: 7.3 g/dL (ref 6.0–8.3)

## 2023-02-28 LAB — LIPID PANEL
Cholesterol: 171 mg/dL (ref 0–200)
HDL: 55.1 mg/dL (ref >39.00)
NonHDL: 116.35
Total CHOL/HDL Ratio: 3
Triglycerides: 295 mg/dL — ABNORMAL HIGH (ref 0.0–149.0)
VLDL: 59 mg/dL — ABNORMAL HIGH (ref 0.0–40.0)

## 2023-02-28 LAB — HEMOGLOBIN A1C: Hgb A1c MFr Bld: 5.8 % (ref 4.6–6.5)

## 2023-02-28 LAB — LDL CHOLESTEROL, DIRECT: Direct LDL: 86 mg/dL

## 2023-02-28 NOTE — Assessment & Plan Note (Signed)
Controlled.  Continue losartan 50 mg daily and metoprolol tartrate 25 mg BID. CMP pending.

## 2023-02-28 NOTE — Assessment & Plan Note (Signed)
Repeat lipid panel pending. Continue simvastatin 20 mg daily.  

## 2023-02-28 NOTE — Assessment & Plan Note (Signed)
Following with Allergist.  Continue Allegra in AM, Zyrtec 10 mg in HS. Continue weekly environmental allergy injections and once monthly allergy injection to cats.

## 2023-02-28 NOTE — Progress Notes (Signed)
Subjective:    Patient ID: Taylor Mullins, female    DOB: 1961-10-28, 62 y.o.   MRN: [id number]  HPI  Taylor Mullins is a very pleasant 62 y.o. female who presents today for complete physical and follow up of chronic conditions.  Immunizations: -Tetanus: Completed in 2015 -Influenza: Completed this season -Shingles: Completed Shingrix series  Diet: Fair diet.  Exercise: No regular exercise.  Eye exam: Completed several years ago Dental exam: Completes semi-annually    Pap Smear: Follows with GYN Mammogram: Completes at GYN office annually   Colonoscopy: Completed in 2019, due 2024 and completed last week.   BP Readings from Last 3 Encounters:  02/28/23 112/76  06/27/22 128/80  02/01/22 114/74       Review of Systems  Constitutional:  Negative for unexpected weight change.  HENT:  Negative for rhinorrhea.   Respiratory:  Negative for cough and shortness of breath.   Cardiovascular:  Negative for chest pain.  Gastrointestinal:  Negative for constipation and diarrhea.  Genitourinary:  Negative for difficulty urinating.  Musculoskeletal:  Negative for arthralgias and myalgias.  Skin:  Negative for rash.  Allergic/Immunologic: Negative for environmental allergies.  Neurological:  Negative for dizziness and headaches.  Psychiatric/Behavioral:  The patient is not nervous/anxious.          Past Medical History:  Diagnosis Date   Essential hypertension    Ganglion cyst    right ankle   Migraine    occasionally    TMJ dysfunction    Urinary problem    Urticaria     Social History   Socioeconomic History   Marital status: Married    Spouse name: Not on file   Number of children: Not on file   Years of education: Not on file   Highest education level: Not on file  Occupational History   Not on file  Tobacco Use   Smoking status: Never   Smokeless tobacco: Never  Vaping Use   Vaping Use: Never used  Substance and Sexual Activity   Alcohol use: Yes     Comment: rarely   Drug use: No   Sexual activity: Not on file  Other Topics Concern   Not on file  Social History Narrative   Married.   3 children.    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    Social Determinants of Health   Financial Resource Strain: Not on file  Food Insecurity: Not on file  Transportation Needs: Not on file  Physical Activity: Not on file  Stress: Not on file  Social Connections: Not on file  Intimate Partner Violence: Not on file    Past Surgical History:  Procedure Laterality Date   ABDOMINAL HYSTERECTOMY     ABLATION     ANTERIOR AND POSTERIOR REPAIR N/A 06/19/2017   Procedure: ANTERIOR (CYSTOCELE) AND POSTERIOR REPAIR (RECTOCELE);  Surgeon: Alfredo Martinez, MD;  Location: [facility];  Service: Urology;  Laterality: N/A;   CESAREAN SECTION  89,92   CYSTOSCOPY N/A 06/19/2017   Procedure: CYSTOSCOPY;  Surgeon: Alfredo Martinez, MD;  Location: [facility];  Service: Urology;  Laterality: N/A;   VAGINAL HYSTERECTOMY N/A 06/19/2017   Procedure: HYSTERECTOMY VAGINAL TOTAL;  Surgeon: Levi Aland, MD;  Location: [facility];  Service: Gynecology;  Laterality: N/A;    Family History  Problem Relation Age of Onset   Colon cancer Mother    Hypertension Mother    Hyperlipidemia Mother  Crohn's disease Father    Heart disease Father     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      EPINEPHrine 0.3 mg/0.3 mL IJ SOAJ injection AS DIRECTED INJECTION AS DIRECTED AS NEEDED SYSTEMIC REACTION 30 DAYS     losartan (COZAAR) 50 MG tablet TAKE 1 TABLET (50 MG TOTAL) BY MOUTH DAILY. FOR BLOOD PRESSURE. 90 tablet 0   metoprolol  tartrate (LOPRESSOR) 25 MG tablet TAKE 1 TABLET (25 MG TOTAL) BY MOUTH 2 (TWO) TIMES DAILY. FOR BLOOD PRESSURE. 180 tablet 0   simvastatin (ZOCOR) 20 MG tablet TAKE 1 TABLET BY MOUTH DAILY. FOR CHOLESTEROL 90 tablet 0   UNABLE TO FIND Gets weekly shots for allergies     No current facility-administered medications on file prior to visit.    BP 112/76   Pulse 73   Temp (!) 97.2 F (36.2 C) (Temporal)   Ht 5\' 9"  (1.753 m)   Wt 237 lb (107.5 kg)   SpO2 97%   BMI 35.00 kg/m  Objective:   Physical Exam HENT:     Right Ear: Tympanic membrane and ear canal normal.     Left Ear: Tympanic membrane and ear canal normal.     Nose: Nose normal.  Eyes:     Conjunctiva/sclera: Conjunctivae normal.     Pupils: Pupils are equal, round, and reactive to light.  Neck:     Thyroid: No thyromegaly.  Cardiovascular:     Rate and Rhythm: Normal rate and regular rhythm.     Heart sounds: No murmur heard. Pulmonary:     Effort: Pulmonary effort is normal.     Breath sounds: Normal breath sounds. No rales.  Abdominal:     General: Bowel sounds are normal.     Palpations: Abdomen is soft.     Tenderness: There is no abdominal tenderness.  Musculoskeletal:        General: Normal range of motion.     Cervical back: Neck supple.  Lymphadenopathy:     Cervical: No cervical adenopathy.  Skin:    General: Skin is warm and dry.     Findings: No rash.  Neurological:     Mental Status: She is alert and oriented to person, place, and time.     Cranial Nerves: No cranial nerve deficit.     Deep Tendon Reflexes: Reflexes are normal and symmetric.  Psychiatric:        Mood and Affect: Mood normal.           Assessment & Plan:  Preventative health care Assessment & Plan: Immunizations UTD. Pap smear UTD, follows with GYN.  Mammogram due, orders placed. Colonoscopy completed last week, TBD regarding next due date. Due again in 2029.   Discussed the importance of a healthy diet and regular  exercise in order for weight loss, and to reduce the risk of further co-morbidity.  Exam stable. Labs pending.  Follow up in 1 year for repeat physical.    Essential hypertension Assessment & Plan: Controlled.  Continue losartan 50 mg daily and metoprolol tartrate 25 mg BID. CMP pending.  Orders: -     Hemoglobin A1c -  Comprehensive metabolic panel  Hyperlipidemia, unspecified hyperlipidemia type Assessment & Plan: Repeat lipid panel pending Continue simvastatin 20 mg daily.   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  Continue Allegra in AM, Zyrtec 10 mg in HS. Continue weekly environmental allergy injections and once monthly allergy injection to cats.          Doreene NestKatherine K Kyria Bumgardner, NP

## 2023-02-28 NOTE — Assessment & Plan Note (Signed)
Immunizations UTD. Pap smear UTD, follows with GYN.  Mammogram due, orders placed. Colonoscopy completed last week, TBD regarding next due date. Due again in 2029.   Discussed the importance of a healthy diet and regular exercise in order for weight loss, and to reduce the risk of further co-morbidity.  Exam stable. Labs pending.  Follow up in 1 year for repeat physical.

## 2023-02-28 NOTE — Patient Instructions (Signed)
Stop by the lab prior to leaving today. I will notify you of your results once received.   It was a pleasure to see you today!  

## 2023-03-14 ENCOUNTER — Other Ambulatory Visit: Payer: Self-pay | Admitting: Primary Care

## 2023-03-14 DIAGNOSIS — E785 Hyperlipidemia, unspecified: Secondary | ICD-10-CM

## 2023-04-19 ENCOUNTER — Telehealth: Payer: Self-pay

## 2023-04-19 ENCOUNTER — Ambulatory Visit
Admission: EM | Admit: 2023-04-19 | Discharge: 2023-04-19 | Disposition: A | Discharge status: Home / Self Care | Payer: BC Managed Care – PPO | Attending: Urgent Care | Admitting: Urgent Care

## 2023-04-19 ENCOUNTER — Other Ambulatory Visit: Payer: Self-pay | Admitting: Primary Care

## 2023-04-19 DIAGNOSIS — J3089 Other allergic rhinitis: Secondary | ICD-10-CM | POA: Diagnosis not present

## 2023-04-19 DIAGNOSIS — R0602 Shortness of breath: Secondary | ICD-10-CM | POA: Diagnosis not present

## 2023-04-19 DIAGNOSIS — I10 Essential (primary) hypertension: Secondary | ICD-10-CM

## 2023-04-19 MED ORDER — PREDNISONE 10 MG (21) PO TBPK: ORAL_TABLET | Freq: Every day | ORAL | 0 refills | Status: DC

## 2023-04-19 MED ORDER — DEXAMETHASONE SODIUM PHOSPHATE 10 MG/ML IJ SOLN
10.0000 mg | Freq: Once | INTRAMUSCULAR | Status: AC
  Administered 2023-04-19: 10 mg via INTRAMUSCULAR

## 2023-04-19 MED ORDER — IPRATROPIUM-ALBUTEROL 0.5-2.5 (3) MG/3ML IN SOLN
3.0000 mL | Freq: Once | RESPIRATORY_TRACT | Status: AC
  Administered 2023-04-19: 3 mL via RESPIRATORY_TRACT

## 2023-04-19 NOTE — ED Provider Notes (Signed)
Taylor Mullins    CSN: [id number] Arrival date & time: 04/19/23  1523      History   Chief Complaint Chief Complaint  Patient presents with   Shortness of Breath    HPI Taylor Mullins is a 62 y.o. female.    Shortness of Breath   Patient presents to urgent care with complaint of shortness of breath.  She states she developed rhinorrhea, cough, low-grade fever yesterday.  Shortness of breath starting today.  She endorses history of severe seasonal allergies and has not received allergy injections for the last 2 weeks.  Denies swelling to throat or face. No trismus, drooling, hoarseness, stridor.  She has EpiPen for emergency use.  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   Cystocele, midline 06/19/2017   Preventative health care 12/19/2016   Essential hypertension 09/18/2016   Recurrent UTI 09/18/2016    Past Surgical History:  Procedure Laterality Date   ABDOMINAL HYSTERECTOMY     ABLATION     ANTERIOR AND POSTERIOR REPAIR N/A 06/19/2017   Procedure: ANTERIOR (CYSTOCELE) AND POSTERIOR REPAIR (RECTOCELE);  Surgeon: Alfredo Martinez, MD;  Location: [facility];  Service: Urology;  Laterality: N/A;   CESAREAN SECTION  89,92   CYSTOSCOPY N/A 06/19/2017   Procedure: CYSTOSCOPY;  Surgeon: Alfredo Martinez, MD;  Location: [facility];  Service: Urology;  Laterality: N/A;   VAGINAL HYSTERECTOMY N/A 06/19/2017   Procedure: HYSTERECTOMY VAGINAL TOTAL;  Surgeon: Levi Aland, MD;  Location: [facility];  Service: Gynecology;  Laterality: N/A;    OB History   No obstetric history on file.      Home Medications    Prior to Admission medications    Medication Sig Start Date End Date Taking? Authorizing Provider  cetirizine (ZYRTEC) 10 MG tablet Take 10 mg by mouth every evening.     [provider]  EPINEPHrine 0.3 mg/0.3 mL IJ SOAJ injection AS DIRECTED INJECTION AS DIRECTED AS NEEDED SYSTEMIC REACTION 30 DAYS    [provider]  losartan (COZAAR) 50 MG tablet TAKE 1 TABLET (50 MG TOTAL) BY MOUTH DAILY. FOR BLOOD PRESSURE. 04/19/23   Doreene Nest, NP  metoprolol tartrate (LOPRESSOR) 25 MG tablet TAKE 1 TABLET (25 MG TOTAL) BY MOUTH 2 (TWO) TIMES DAILY. FOR BLOOD PRESSURE. 02/21/23   Doreene Nest, NP  simvastatin (ZOCOR) 20 MG tablet TAKE 1 TABLET BY MOUTH EVERY DAY FOR CHOLESTEROL 03/14/23   Doreene Nest, NP  UNABLE TO FIND Gets weekly shots for allergies    [provider]    Family History Family History  Problem Relation Age of Onset   Colon cancer Mother    Hypertension Mother    Hyperlipidemia Mother    Crohn's disease Father    Heart disease Father     Social History Social History   Tobacco Use   Smoking status: Never   Smokeless tobacco: Never  Vaping Use   Vaping Use: Never used  Substance Use Topics   Alcohol use: Yes    Comment: rarely   Drug use: No     Allergies   Shrimp [  shellfish allergy]   Review of Systems Review of Systems  Respiratory:  Positive for shortness of breath.      Physical Exam Triage Vital Signs ED Triage Vitals  Enc Vitals Group     BP 04/19/23 1534 (!) 168/83     Pulse Rate 04/19/23 1534 (!) 108     Resp 04/19/23 1534 18     Temp 04/19/23 1534 99.3 F (37.4 C)     Temp Source 04/19/23 1534 Oral     SpO2 04/19/23 1534 93 %     Weight --      Height --      Head Circumference --      Peak Flow --      Pain Score 04/19/23 1541 0     Pain Loc --      Pain Edu? --      Excl. in GC? --    No data found.  Updated Vital Signs BP (!) 168/83 (BP Location: Left Arm)   Pulse (!) 108   Temp 99.3 F (37.4 C) (Oral)   Resp 18    SpO2 98% Comment: post neb treatment  Visual Acuity Right Eye Distance:   Left Eye Distance:   Bilateral Distance:    Right Eye Near:   Left Eye Near:    Bilateral Near:     Physical Exam Vitals reviewed.  Constitutional:      Appearance: She is well-developed.  Pulmonary:     Effort: Accessory muscle usage present.     Breath sounds: Decreased air movement present. Decreased breath sounds present.     Comments: Decreased breath sounds auscultated especially in the upper lobes prior to DuoNeb treatment with SaO2 equal 93%.  Improved breath sounds following DuoNeb treatment O2 equal 98%.  Patient is unsure of relief of symptoms. Skin:    General: Skin is warm and dry.  Neurological:     General: No focal deficit present.     Mental Status: She is alert and oriented to person, place, and time.  Psychiatric:        Mood and Affect: Mood is anxious.        Behavior: Behavior normal.      UC Treatments / Results  Labs (all labs ordered are listed, but only abnormal results are displayed) Labs Reviewed - No data to display  EKG   Radiology No results found.  Procedures Procedures (including critical care time)  Medications Ordered in UC Medications - No data to display  Initial Impression / Assessment and Plan / UC Course  I have reviewed the triage vital signs and the nursing notes.  Pertinent labs & imaging results that were available during my care of the patient were reviewed by me and considered in my medical decision making (see chart for details).   Taylor Mullins is a 62 y.o. female presenting with SOB. Patient is afebrile without recent antipyretics, satting well on room air. Overall is anxious appearing, non-toxic, well hydrated, without respiratory distress. Pulmonary exam is remarkable for decreased breath sounds with SaO2 equal 93%.  Lungs CTAB without wheezing, rhonchi, rales. RRR.  Reviewed relevant chart history.   Given decreased SAO 2 and  patient's feeling of distress due to shortness of breath, administered DuoNeb via nebulizer in clinic.  After administration, patient is unsure of feeling of improved symptoms however, on exam, improved breath sounds following DuoNeb treatment O2 equal 98%.  Will administer Decadron 10 mg IM and assess response.  Patient endorses improved  symptoms.  Appears stable for discharge.  Will discharge with prescription for prednisone taper and instructed to follow-up with her allergy provider for evaluation and additional treatment if necessary.  Counseled patient on potential for adverse effects with medications prescribed/recommended today, ER and return-to-clinic precautions discussed, patient verbalized understanding and agreement with care plan.   Final Clinical Impressions(s) / UC Diagnoses   Final diagnoses:  None   Discharge Instructions   None    ED Prescriptions   None    PDMP not reviewed this encounter.   Charma Igo, [place] 04/19/23 [phone]

## 2023-04-19 NOTE — ED Triage Notes (Signed)
Patient presents to UC for SOB. States she developed a runny nose, cough, low grade fever on weds. SOB started this afternoon. Hx of severe seasonal. Has not received her allergy injections for the last two weeks. She does carry an Epipen incase she has to use.  Denies swelling to throat or face.

## 2023-04-19 NOTE — Discharge Instructions (Addendum)
Please schedule follow-up evaluation with your allergy provider.

## 2023-04-19 NOTE — Telephone Encounter (Signed)
Pt walked in;pt said starting on 04/18/23 pt has runny nose,dry cough,and temp 99.1. pt said no wheezing and no CP. Pt said 04/19/23 about 1 hr ago pt was sitting at home and suddenly became SOB; no CP,H/A or dizziness. Pt took 30 ml of dayquil at 12 noon and now T 97.9 P109 pulse ox 97% room air and BP 160/84 lge cuff, sitting lt arm. Pt said that she does feel anxious because of SOB. Pt has not missed taking losartan or metoprolol. No available appts at any [facility] offices this afternoon; pt does not think she needs to wait until tomorrow to be seen. Pt is going to [employer] now. Pt said she is capable of driving herself to [facility] in [place].UC & ED precautions given and pt voiced understanding. Sending note to Allayne Gitelman NP.

## 2023-04-19 NOTE — Telephone Encounter (Signed)
Noted and appreciate nursing assessment in triage. Agree with urgent care evaluation.

## 2023-04-19 NOTE — ED Notes (Signed)
Patient monitored post injection; tolerated well.

## 2023-05-15 ENCOUNTER — Other Ambulatory Visit: Payer: Self-pay | Admitting: Primary Care

## 2023-05-15 DIAGNOSIS — I10 Essential (primary) hypertension: Secondary | ICD-10-CM

## 2023-07-30 ENCOUNTER — Encounter: Payer: Self-pay | Admitting: Internal Medicine

## 2023-07-30 ENCOUNTER — Ambulatory Visit: Payer: BC Managed Care – PPO | Admitting: Internal Medicine

## 2023-07-30 VITALS — BP 138/76 | HR 92 | Temp 97.7°F | Ht 69.0 in | Wt 240.0 lb

## 2023-07-30 DIAGNOSIS — H9202 Otalgia, left ear: Secondary | ICD-10-CM | POA: Diagnosis not present

## 2023-07-30 MED ORDER — PREDNISONE 20 MG PO TABS: 40.0000 mg | ORAL_TABLET | Freq: Every day | ORAL | 0 refills | Status: DC

## 2023-07-30 NOTE — Progress Notes (Signed)
Subjective:    Patient ID: Taylor Mullins, female    DOB: 19-Jan-1961, 62 y.o.   MRN: [id number]  HPI Here due to left ear pain  Intermittent left ear pain over the past week Had bilateral TM rupture after flight about 10 years ago 1 year ago---had right ear symptoms  No recent travel--other than flat in car No recent cold symptoms  Does take allergy injections weekly---and zyrtec  Current Outpatient Medications on File Prior to Visit  Medication Sig Dispense Refill   cetirizine (ZYRTEC) 10 MG tablet Take 10 mg by mouth every evening.      EPINEPHrine 0.3 mg/0.3 mL IJ SOAJ injection AS DIRECTED INJECTION AS DIRECTED AS NEEDED SYSTEMIC REACTION 30 DAYS     losartan (COZAAR) 50 MG tablet TAKE 1 TABLET (50 MG TOTAL) BY MOUTH DAILY. FOR BLOOD PRESSURE. 90 tablet 2   metoprolol tartrate (LOPRESSOR) 25 MG tablet TAKE 1 TABLET (25 MG TOTAL) BY MOUTH 2 (TWO) TIMES DAILY. FOR BLOOD PRESSURE. 180 tablet 2   simvastatin (ZOCOR) 20 MG tablet TAKE 1 TABLET BY MOUTH EVERY DAY FOR CHOLESTEROL 90 tablet 3   UNABLE TO FIND Gets weekly shots for allergies     No current facility-administered medications on file prior to visit.    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    Lips swell and mouth; only occurs with shrimp and millusk ; able to eat crabs and lobster with no issue     Past Medical History:  Diagnosis Date   Essential hypertension    Ganglion cyst    right ankle   Migraine    occasionally    TMJ dysfunction    Urinary problem    Urticaria     Past Surgical History:  Procedure Laterality Date   ABDOMINAL HYSTERECTOMY     ABLATION     ANTERIOR AND POSTERIOR REPAIR N/A 06/19/2017   Procedure: ANTERIOR (CYSTOCELE) AND POSTERIOR REPAIR (RECTOCELE);  Surgeon: Alfredo Martinez, MD;  Location: [facility];  Service: Urology;  Laterality: N/A;   CESAREAN SECTION  89,92   CYSTOSCOPY N/A 06/19/2017   Procedure: CYSTOSCOPY;  Surgeon: Alfredo Martinez, MD;   Location: [facility];  Service: Urology;  Laterality: N/A;   VAGINAL HYSTERECTOMY N/A 06/19/2017   Procedure: HYSTERECTOMY VAGINAL TOTAL;  Surgeon: Levi Aland, MD;  Location: [facility];  Service: Gynecology;  Laterality: N/A;    Family History  Problem Relation Age of Onset   Colon cancer Mother    Hypertension Mother    Hyperlipidemia Mother    Crohn's disease Father    Heart disease Father     Social History   Socioeconomic History   Marital status: Married    Spouse name: Not on file   Number of children: Not on file   Years of education: Not on file   Highest education level: Master's degree (e.g., MA, MS, MEng, MEd, MSW, MBA)  Occupational History   Not on file  Tobacco Use   Smoking status: Never   Smokeless tobacco: Never  Vaping Use   Vaping status: Never Used  Substance and Sexual Activity   Alcohol use: Yes    Comment: rarely   Drug use: No   Sexual activity: Not on file  Other Topics Concern   Not on file  Social History Narrative   Married.   3 children.    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  Social Determinants of Health   Financial Resource Strain: Low Risk  (07/28/2023)   Overall Financial Resource Strain (CARDIA)    Difficulty of Paying Living Expenses: Not hard at all  Food Insecurity: No Food Insecurity (07/28/2023)   Hunger Vital Sign    Worried About Running Out of Food in the Last Year: Never true    Ran Out of Food in the Last Year: Never true  Transportation Needs: No Transportation Needs (07/28/2023)   PRAPARE - Administrator, Civil Service (Medical): No    Lack of Transportation (Non-Medical): No  Physical Activity: Insufficiently Active (07/28/2023)   Exercise Vital Sign    Days of Exercise per Week: 3 days    Minutes of Exercise per Session: 30 min  Stress: Stress Concern Present (07/28/2023)   [employer] of Occupational Health - Occupational Stress Questionnaire     Feeling of Stress : To some extent  Social Connections: Socially Integrated (07/28/2023)   Social Connection and Isolation Panel [NHANES]    Frequency of Communication with Friends and Family: Twice a week    Frequency of Social Gatherings with Friends and Family: Twice a week    Attends Religious Services: More than 4 times per year    Active Member of [employer] or Organizations: Yes    Attends Engineer, structural: More than 4 times per year    Marital Status: Married  Catering manager Violence: Not on file   Review of Systems No fever No change in hearing No tinnitus Will be driving over mountains to [place] soon    Objective:   Physical Exam Constitutional:      Appearance: Normal appearance.  HENT:     Head:     Comments: No sinus tenderness    Right Ear: Tympanic membrane and ear canal normal.     Left Ear: Tympanic membrane and ear canal normal.     Mouth/Throat:     Pharynx: No oropharyngeal exudate or posterior oropharyngeal erythema.  Musculoskeletal:     Cervical back: Neck supple.  Lymphadenopathy:     Cervical: No cervical adenopathy.  Neurological:     Mental Status: She is alert.            Assessment & Plan:

## 2023-07-30 NOTE — Assessment & Plan Note (Signed)
Looks normal--but probably has Eustachian tube dysfunction Discussed increasing allergy regimen---allegra AM, zyrtec PM Start flonase for now Will give prednisone 40mg  x 3 days if pain worsens going [place]

## 2024-01-06 ENCOUNTER — Other Ambulatory Visit: Payer: Self-pay | Admitting: Primary Care

## 2024-01-06 DIAGNOSIS — I10 Essential (primary) hypertension: Secondary | ICD-10-CM

## 2024-01-08 ENCOUNTER — Other Ambulatory Visit: Payer: Self-pay | Admitting: Primary Care

## 2024-01-08 DIAGNOSIS — I10 Essential (primary) hypertension: Secondary | ICD-10-CM

## 2024-03-04 ENCOUNTER — Ambulatory Visit (INDEPENDENT_AMBULATORY_CARE_PROVIDER_SITE_OTHER): Payer: BC Managed Care – PPO | Admitting: Primary Care

## 2024-03-04 ENCOUNTER — Encounter: Payer: Self-pay | Admitting: Primary Care

## 2024-03-04 VITALS — BP 130/82 | HR 62 | Temp 98.6°F | Ht 69.0 in | Wt 223.0 lb

## 2024-03-04 DIAGNOSIS — Z0000 Encounter for general adult medical examination without abnormal findings: Secondary | ICD-10-CM | POA: Diagnosis not present

## 2024-03-04 DIAGNOSIS — Z9109 Other allergy status, other than to drugs and biological substances: Secondary | ICD-10-CM

## 2024-03-04 DIAGNOSIS — Z23 Encounter for immunization: Secondary | ICD-10-CM

## 2024-03-04 DIAGNOSIS — I10 Essential (primary) hypertension: Secondary | ICD-10-CM

## 2024-03-04 DIAGNOSIS — E785 Hyperlipidemia, unspecified: Secondary | ICD-10-CM | POA: Diagnosis not present

## 2024-03-04 LAB — CBC
HCT: 44.1 % (ref 36.0–46.0)
Hemoglobin: 14.4 g/dL (ref 12.0–15.0)
MCHC: 32.7 g/dL (ref 30.0–36.0)
MCV: 91.2 fl (ref 78.0–100.0)
Platelets: 255 10*3/uL (ref 150.0–400.0)
RBC: 4.83 Mil/uL (ref 3.87–5.11)
RDW: 13.6 % (ref 11.5–15.5)
WBC: 6 10*3/uL (ref 4.0–10.5)

## 2024-03-04 LAB — COMPREHENSIVE METABOLIC PANEL WITH GFR
ALT: 16 U/L (ref 0–35)
AST: 17 U/L (ref 0–37)
Albumin: 4.7 g/dL (ref 3.5–5.2)
Alkaline Phosphatase: 76 U/L (ref 39–117)
BUN: 19 mg/dL (ref 6–23)
CO2: 27 meq/L (ref 19–32)
Calcium: 9.6 mg/dL (ref 8.4–10.5)
Chloride: 104 meq/L (ref 96–112)
Creatinine, Ser: 0.75 mg/dL (ref 0.40–1.20)
GFR: 85.61 mL/min (ref >60.00)
Glucose, Bld: 107 mg/dL — ABNORMAL HIGH (ref 70–99)
Potassium: 4 meq/L (ref 3.5–5.1)
Sodium: 140 meq/L (ref 135–145)
Total Bilirubin: 1.1 mg/dL (ref 0.2–1.2)
Total Protein: 6.9 g/dL (ref 6.0–8.3)

## 2024-03-04 LAB — LIPID PANEL
Cholesterol: 175 mg/dL (ref 0–200)
HDL: 57.9 mg/dL (ref >39.00)
LDL Cholesterol: 54 mg/dL (ref 0–99)
NonHDL: 117.51
Total CHOL/HDL Ratio: 3
Triglycerides: 320 mg/dL — ABNORMAL HIGH (ref 0.0–149.0)
VLDL: 64 mg/dL — ABNORMAL HIGH (ref 0.0–40.0)

## 2024-03-04 LAB — HEMOGLOBIN A1C: Hgb A1c MFr Bld: 5.7 % (ref 4.6–6.5)

## 2024-03-04 MED ORDER — EPINEPHRINE 0.3 MG/0.3ML IJ SOAJ: 0.3000 mg | INTRAMUSCULAR | 0 refills | Status: AC | PRN

## 2024-03-04 NOTE — Patient Instructions (Signed)
 Stop by the lab prior to leaving today. I will notify you of your results once received.   It was a pleasure to see you today!

## 2024-03-04 NOTE — Assessment & Plan Note (Signed)
 Refill provided for Epi Pen.  No use ever

## 2024-03-04 NOTE — Assessment & Plan Note (Signed)
 Tdap due, provided today.  Mammogram UTD, follows with GYN Colonoscopy UTD, due 2029  Discussed the importance of a healthy diet and regular exercise in order for weight loss, and to reduce the risk of further co-morbidity.  Exam stable. Labs pending.  Follow up in 1 year for repeat physical.

## 2024-03-04 NOTE — Progress Notes (Signed)
 Subjective:    Patient ID: Taylor Mullins, female    DOB: [birth year], 63 y.o.   MRN: [id number]  HPI  Taylor Mullins is a very pleasant 63 y.o. female who presents today for complete physical and follow up of chronic conditions.  Immunizations: -Tetanus: Completed in 2015 -Shingles: Completed Shingrix series  Diet: Fair diet.  Exercise: No regular exercise.  Eye exam: Completes annually  Dental exam: Completes semi-annually    Mammogram: Completed in January 2025 per GYN office.  Colonoscopy: Completed in 2024, due 2029  BP Readings from Last 3 Encounters:  03/04/24 130/82  07/30/23 138/76  04/19/23 (!) 168/83       Review of Systems  Constitutional:  Negative for unexpected weight change.  HENT:  Negative for rhinorrhea.   Respiratory:  Negative for cough and shortness of breath.   Cardiovascular:  Negative for chest pain.  Gastrointestinal:  Negative for constipation and diarrhea.  Genitourinary:  Negative for difficulty urinating.  Musculoskeletal:  Negative for arthralgias and myalgias.  Skin:  Negative for rash.  Allergic/Immunologic: Negative for environmental allergies.  Neurological:  Negative for dizziness and headaches.  Psychiatric/Behavioral:  The patient is nervous/anxious.          Past Medical History:  Diagnosis Date   Essential hypertension    Ganglion cyst    right ankle   Migraine    occasionally    TMJ dysfunction    Urinary problem    Urticaria     Social History   Socioeconomic History   Marital status: Married    Spouse name: Not on file   Number of children: Not on file   Years of education: Not on file   Highest education level: Master's degree (e.g., MA, MS, MEng, MEd, MSW, MBA)  Occupational History   Not on file  Tobacco Use   Smoking status: Never   Smokeless tobacco: Never  Vaping Use   Vaping status: Never Used  Substance and Sexual Activity   Alcohol use: Yes    Comment: rarely   Drug use: No   Sexual  activity: Not on file  Other Topics Concern   Not on file  Social History Narrative   Married.   3 children.    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    Social Drivers of Corporate investment banker Strain: Low Risk  (07/28/2023)   Overall Financial Resource Strain (CARDIA)    Difficulty of Paying Living Expenses: Not hard at all  Food Insecurity: No Food Insecurity (07/28/2023)   Hunger Vital Sign    Worried About Running Out of Food in the Last Year: Never true    Ran Out of Food in the Last Year: Never true  Transportation Needs: No Transportation Needs (07/28/2023)   PRAPARE - Administrator, Civil Service (Medical): No    Lack of Transportation (Non-Medical): No  Physical Activity: Insufficiently Active (07/28/2023)   Exercise Vital Sign    Days of Exercise per Week: 3 days    Minutes of Exercise per Session: 30 min  Stress: Stress Concern Present (07/28/2023)   [employer] of Occupational Health - Occupational Stress Questionnaire    Feeling of Stress : To some extent  Social Connections: Socially Integrated (07/28/2023)   Social Connection and Isolation Panel [NHANES]    Frequency of Communication with Friends and Family: Twice a week    Frequency of Social Gatherings with Friends and  Family: Twice a week    Attends Religious Services: More than 4 times per year    Active Member of Clubs or Organizations: Yes    Attends Engineer, structural: More than 4 times per year    Marital Status: Married  Catering manager Violence: Not on file    Past Surgical History:  Procedure Laterality Date   ABDOMINAL HYSTERECTOMY     ABLATION     ANTERIOR AND POSTERIOR REPAIR N/A 06/19/2017   Procedure: ANTERIOR (CYSTOCELE) AND POSTERIOR REPAIR (RECTOCELE);  Surgeon: Erman Hayward, MD;  Location: [facility];  Service: Urology;  Laterality: N/A;   CESAREAN SECTION  89,92   CYSTOSCOPY N/A 06/19/2017   Procedure:  CYSTOSCOPY;  Surgeon: Erman Hayward, MD;  Location: [facility];  Service: Urology;  Laterality: N/A;   VAGINAL HYSTERECTOMY N/A 06/19/2017   Procedure: HYSTERECTOMY VAGINAL TOTAL;  Surgeon: Hamp Levine, MD;  Location: [facility];  Service: Gynecology;  Laterality: N/A;    Family History  Problem Relation Age of Onset   Colon cancer Mother    Hypertension Mother    Hyperlipidemia Mother    Endometrial cancer Mother    Crohn's disease Father    Heart disease Father     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      losartan (COZAAR) 50 MG tablet TAKE 1 TABLET (50 MG TOTAL) BY MOUTH DAILY. FOR BLOOD PRESSURE. 90 tablet 0   metoprolol tartrate (LOPRESSOR) 25 MG tablet TAKE 1 TABLET (25 MG TOTAL) BY MOUTH 2 (TWO) TIMES DAILY. FOR BLOOD PRESSURE. 180 tablet 2   simvastatin (ZOCOR) 20 MG tablet TAKE 1 TABLET BY MOUTH EVERY DAY FOR CHOLESTEROL 90 tablet 3   UNABLE TO FIND Gets weekly shots for allergies     No current facility-administered medications on file prior to visit.    BP 130/82   Pulse 62   Temp 98.6 F (37 C) (Oral)   Ht 5\' 9"  (1.753 m)   Wt 223 lb (101.2 kg)   SpO2 97%   BMI 32.93 kg/m  Objective:   Physical Exam HENT:     Right Ear: Tympanic membrane and ear canal normal.     Left Ear: Tympanic membrane and ear canal normal.  Eyes:     Pupils: Pupils are equal, round, and reactive to light.  Cardiovascular:     Rate and Rhythm: Normal rate and regular rhythm.  Pulmonary:     Effort: Pulmonary effort is normal.     Breath sounds: Normal breath sounds.  Abdominal:      General: Bowel sounds are normal.     Palpations: Abdomen is soft.     Tenderness: There is no abdominal tenderness.  Musculoskeletal:        General: Normal range of motion.     Cervical back: Neck supple.  Skin:    General: Skin is warm and dry.  Neurological:     Mental Status: She is alert and oriented to person, place, and time.     Cranial Nerves: No cranial nerve deficit.     Deep  Tendon Reflexes:     Reflex Scores:      Patellar reflexes are 2+ on the right side and 2+ on the left side. Psychiatric:        Mood and Affect: Mood normal.           Assessment & Plan:  Preventative health care Assessment & Plan: Tdap due, provided today.  Mammogram UTD, follows with GYN Colonoscopy UTD, due 2029  Discussed the importance of a healthy diet and regular exercise in order for weight loss, and to reduce the risk of further co-morbidity.  Exam stable. Labs pending.  Follow up in 1 year for repeat physical.    Essential hypertension Assessment & Plan: Controlled.   Continue losartan 50 mg daily, metoprolol tartrate 25 mg BID. CMP pending.   Orders: -     CBC -     Comprehensive metabolic panel with GFR  Hyperlipidemia, unspecified hyperlipidemia type Assessment & Plan: Repeat lipid panel pending. Continue simvastatin 20 mg daily.  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  No use ever   Other orders -     EPINEPHrine; Inject 0.3 mg into the muscle as needed for anaphylaxis.  Dispense: 1 each; Refill: 0        Gabriel John, NP

## 2024-03-04 NOTE — Assessment & Plan Note (Addendum)
 Controlled.   Continue losartan 50 mg daily, metoprolol tartrate 25 mg BID. CMP pending.

## 2024-03-04 NOTE — Assessment & Plan Note (Signed)
Repeat lipid panel pending. Continue simvastatin 20 mg daily.  

## 2024-03-05 ENCOUNTER — Other Ambulatory Visit: Payer: Self-pay

## 2024-03-05 ENCOUNTER — Encounter: Payer: Self-pay | Admitting: Emergency Medicine

## 2024-03-05 ENCOUNTER — Ambulatory Visit: Admission: EM | Admit: 2024-03-05 | Discharge: 2024-03-05 | Disposition: A | Discharge status: Home / Self Care

## 2024-03-05 DIAGNOSIS — T7840XA Allergy, unspecified, initial encounter: Secondary | ICD-10-CM | POA: Diagnosis not present

## 2024-03-05 NOTE — ED Triage Notes (Signed)
 Patient presents to [facility] for evaluatio of shortness of breath that started in the shower right after being outside mowing the lawn.  Her father passed recently, and her mother is full care with dementia and now living with her (she was in [facility] caring for her for the past 6 weeks).  She carries an epi pen every where with her due to allergy to shrimp and mollusks, but this doesn't feel quite the same.  She has had one similar episode in the past and she believes she was seen here.  They gave her O2 and possibly a breathing treatment with improvement.  She has been waiting in the lobby and states symptoms resolve while waiting, but would like to be looked over.

## 2024-03-05 NOTE — ED Provider Notes (Signed)
 [facility]  Note:  This document was prepared using Conservation officer, historic buildings and may include unintentional dictation errors.  MRN: [id number] DOB: 1961-07-22  Subjective:   Taylor Mullins is a 63 y.o. female presenting for exacerbation of seasonal allergies.  Patient reports that this afternoon she was mowing the yard after which she went into take a shower and during the shower she became short of breath.  Patient reports it was hard for her to get a deep breath in.  The patient has history of significant allergies and has EpiPen for allergies to shrimp and other shellfish however she states that this feels different than her previous issues with shellfish.  Patient did not give herself an epinephrine injection did not take any over-the-counter antihistamine medication.  Patient states that she normally gets a once weekly allergy shot but she has not gotten her allergy shot 6 weeks because she was out of town taking care of her mother in [place] .  Patient states that all of her symptoms resolved while waiting to be evaluated in urgent care waiting room.  Patient denies any wheezing, chest pain, weakness, dizziness at this time.  No shortness of breath.  No current facility-administered medications for this encounter.  Current Outpatient Medications:    cetirizine (ZYRTEC) 10 MG tablet, Take 10 mg by mouth every evening. , Disp: , Rfl:    EPINEPHrine 0.3 mg/0.3 mL IJ SOAJ injection, Inject 0.3 mg into the muscle as needed for anaphylaxis., Disp: 1 each, Rfl: 0   losartan (COZAAR) 50 MG tablet, TAKE 1 TABLET (50 MG TOTAL) BY MOUTH DAILY. FOR BLOOD PRESSURE., Disp: 90 tablet, Rfl: 0   metoprolol tartrate (LOPRESSOR) 25 MG tablet, TAKE 1 TABLET (25 MG TOTAL) BY MOUTH 2 (TWO) TIMES DAILY. 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    Lips swell and mouth; only occurs with shrimp and millusk ; able to eat crabs and lobster with no issue    Statins Hives    Product containing 3-hydroxy-3-methylglutaryl-coenzyme A reductase inhibitor (product)    Past Medical History:  Diagnosis Date   Essential hypertension    Ganglion cyst    right ankle   Migraine    occasionally    TMJ dysfunction    Urinary problem    Urticaria      Past Surgical History:  Procedure Laterality Date   ABDOMINAL HYSTERECTOMY     ABLATION     ANTERIOR AND POSTERIOR REPAIR N/A 06/19/2017   Procedure: ANTERIOR (CYSTOCELE) AND POSTERIOR REPAIR (RECTOCELE);  Surgeon: Erman Hayward, MD;  Location: [facility];  Service: Urology;  Laterality: N/A;   CESAREAN SECTION  89,92   CYSTOSCOPY N/A 06/19/2017   Procedure: CYSTOSCOPY;  Surgeon: Erman Hayward, MD;  Location: [facility];  Service: Urology;  Laterality: N/A;   VAGINAL HYSTERECTOMY N/A 06/19/2017   Procedure: HYSTERECTOMY VAGINAL TOTAL;  Surgeon: Hamp Levine, MD;  Location: [facility];  Service: Gynecology;  Laterality: N/A;    Family History  Problem Relation Age of Onset   Colon cancer Mother    Hypertension Mother    Hyperlipidemia Mother    Endometrial cancer Mother    Crohn's disease Father    Heart disease Father  Social History   Tobacco Use   Smoking status: Never   Smokeless tobacco: Never  Vaping Use   Vaping status: Never Used  Substance Use Topics   Alcohol use: Yes    Comment: rarely   Drug use: No    ROS Refer to HPI for ROS details.  Objective:   Vitals: BP (!) 143/91 (BP Location: Left Arm)   Pulse 97   Temp 98.9 F (37.2 C) (Oral)   Resp 18   SpO2 97%   Physical Exam Vitals and nursing note reviewed.  Constitutional:      General: She is not in acute distress.    Appearance: She is well-developed. She is not ill-appearing or  toxic-appearing.  HENT:     Head: Normocephalic and atraumatic.     Nose: Nose normal. No congestion or rhinorrhea.     Mouth/Throat:     Mouth: Mucous membranes are moist.     Pharynx: Oropharynx is clear. No oropharyngeal exudate or posterior oropharyngeal erythema.  Eyes:     Extraocular Movements: Extraocular movements intact.     Conjunctiva/sclera: Conjunctivae normal.  Cardiovascular:     Rate and Rhythm: Normal rate and regular rhythm.     Heart sounds: No murmur heard. Pulmonary:     Effort: Pulmonary effort is normal. No respiratory distress.     Breath sounds: Normal breath sounds. No decreased breath sounds, wheezing, rhonchi or rales.  Skin:    General: Skin is warm and dry.  Neurological:     General: No focal deficit present.     Mental Status: She is alert and oriented to person, place, and time.  Psychiatric:        Mood and Affect: Mood normal.        Behavior: Behavior normal.     Procedures  No results found for this or any previous visit (from the past 24 hours).  No results found.   Assessment and Plan :     Discharge Instructions      1. Allergic reaction, initial encounter (Primary) - Continue to monitor allergy symptoms for change in current severity if there is any escalation of symptoms or development of new symptoms follow-up in ER for further evaluation management. - Continue to take daily antihistamine medication as directed for seasonal allergy symptoms. - Follow-up next week as scheduled for your weekly allergy injection to prevent exacerbation of allergies. - If you experience any similar symptoms in the future when mowing take antihistamine medication and follow-up with urgent care or ER for further evaluation if symptoms do not subside.    Deonte Otting B Jahon Bart   Kino Dunsworth, [place] B, [place] 03/05/24 1846

## 2024-03-05 NOTE — Discharge Instructions (Addendum)
 1. Allergic reaction, initial encounter (Primary) - Continue to monitor allergy symptoms for change in current severity if there is any escalation of symptoms or development of new symptoms follow-up in ER for further evaluation management. - Continue to take daily antihistamine medication as directed for seasonal allergy symptoms. - Follow-up next week as scheduled for your weekly allergy injection to prevent exacerbation of allergies. - If you experience any similar symptoms in the future when mowing take antihistamine medication and follow-up with urgent care or ER for further evaluation if symptoms do not subside.

## 2024-03-07 ENCOUNTER — Other Ambulatory Visit: Payer: Self-pay | Admitting: Primary Care

## 2024-03-07 DIAGNOSIS — E785 Hyperlipidemia, unspecified: Secondary | ICD-10-CM

## 2024-03-08 ENCOUNTER — Other Ambulatory Visit: Payer: Self-pay | Admitting: Primary Care

## 2024-03-08 DIAGNOSIS — I10 Essential (primary) hypertension: Secondary | ICD-10-CM

## 2024-04-04 ENCOUNTER — Other Ambulatory Visit: Payer: Self-pay | Admitting: Primary Care

## 2024-04-04 DIAGNOSIS — I10 Essential (primary) hypertension: Secondary | ICD-10-CM

## 2024-10-28 ENCOUNTER — Encounter: Payer: Self-pay | Admitting: Family Medicine

## 2024-10-28 ENCOUNTER — Ambulatory Visit: Admitting: Family Medicine

## 2024-10-28 VITALS — BP 132/80 | HR 82 | Temp 97.7°F | Ht 69.0 in | Wt 239.2 lb

## 2024-10-28 DIAGNOSIS — R300 Dysuria: Secondary | ICD-10-CM

## 2024-10-28 LAB — POC URINALSYSI DIPSTICK (AUTOMATED)
Bilirubin, UA: NEGATIVE
Blood, UA: NEGATIVE
Glucose, UA: NEGATIVE
Ketones, UA: NEGATIVE
Nitrite, UA: NEGATIVE
Protein, UA: NEGATIVE
Spec Grav, UA: 1.02 (ref 1.010–1.025)
Urobilinogen, UA: 0.2 U/dL
pH, UA: 6 (ref 5.0–8.0)

## 2024-10-28 MED ORDER — CEPHALEXIN 500 MG PO CAPS: 500.0000 mg | ORAL_CAPSULE | Freq: Two times a day (BID) | ORAL | 0 refills | Status: AC

## 2024-10-28 MED ORDER — FEXOFENADINE HCL 180 MG PO TABS: 180.0000 mg | ORAL_TABLET | Freq: Every day | ORAL | Status: AC

## 2024-10-28 MED ORDER — FAMOTIDINE 20 MG PO TABS: 20.0000 mg | ORAL_TABLET | Freq: Every day | ORAL | Status: AC

## 2024-10-28 NOTE — Progress Notes (Unsigned)
 dysuria: not burning with urination but some urgency.   duration of symptoms: 3 days.  abdominal pain: lower abd pressure for about 3 days.   fevers: no back pain: no new issues, she has h/o back pain at baseline.   vomiting:no  Allergy list updated, statin removed.  She has taking simvastatin  for years w/o difficulty.    Her father passed, mother is current in ALF at [facility], currently on hospice.    Meds, vitals, and allergies reviewed.   Per HPI unless specifically indicated in ROS section   GEN: nad, alert and oriented HEENT: ncat NECK: supple CV: rrr.  PULM: ctab, no inc wob ABD: soft, +bs, suprapubic area not tender EXT: no edema SKIN: well perfused.   BACK: no CVA pain

## 2024-10-28 NOTE — Patient Instructions (Addendum)
Drink plenty of water and start the antibiotics if you have more symptoms.  We'll contact you with your lab report.  Take care.

## 2024-10-29 ENCOUNTER — Ambulatory Visit: Payer: Self-pay | Admitting: Family Medicine

## 2024-10-29 DIAGNOSIS — R300 Dysuria: Secondary | ICD-10-CM | POA: Insufficient documentation

## 2024-10-29 LAB — URINE CULTURE
MICRO NUMBER:: 17332337
SPECIMEN QUALITY:: ADEQUATE

## 2024-10-29 NOTE — Assessment & Plan Note (Signed)
 Possible UTI.  Hold antibiotic for now. Drink plenty of water  and start Keflex  if more symptoms.  See notes on urine culture when resulted. Urinalysis discussed with patient.

## 2024-12-12 ENCOUNTER — Other Ambulatory Visit: Payer: Self-pay | Admitting: Primary Care

## 2024-12-12 DIAGNOSIS — I10 Essential (primary) hypertension: Secondary | ICD-10-CM

## 2025-03-05 ENCOUNTER — Encounter: Admitting: Primary Care
